# Patient Record
Sex: Female | Born: 1990 | Race: Black or African American | Hispanic: No | Marital: Single | Smoking: Never smoker
Health system: Southern US, Community
[De-identification: ages and names within clinical notes are randomized; demographics above are authoritative.]

## PROBLEM LIST (undated history)

## (undated) ENCOUNTER — Inpatient Hospital Stay (HOSPITAL_COMMUNITY): Payer: Self-pay

## (undated) DIAGNOSIS — R569 Unspecified convulsions: Secondary | ICD-10-CM

## (undated) DIAGNOSIS — G43909 Migraine, unspecified, not intractable, without status migrainosus: Secondary | ICD-10-CM

## (undated) HISTORY — PX: NO PAST SURGERIES: SHX2092

## (undated) HISTORY — DX: Migraine, unspecified, not intractable, without status migrainosus: G43.909

## (undated) HISTORY — DX: Unspecified convulsions: R56.9

---
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
## 2008-01-21 ENCOUNTER — Inpatient Hospital Stay (HOSPITAL_COMMUNITY): Admission: AD | Admit: 2008-01-21 | Discharge: 2008-01-22 | Payer: Self-pay | Admitting: Obstetrics and Gynecology

## 2008-06-01 ENCOUNTER — Inpatient Hospital Stay (HOSPITAL_COMMUNITY): Admission: AD | Admit: 2008-06-01 | Discharge: 2008-06-01 | Payer: Self-pay | Admitting: Obstetrics and Gynecology

## 2008-07-10 ENCOUNTER — Inpatient Hospital Stay (HOSPITAL_COMMUNITY): Admission: AD | Admit: 2008-07-10 | Discharge: 2008-07-13 | Payer: Self-pay | Admitting: Obstetrics and Gynecology

## 2009-01-23 ENCOUNTER — Inpatient Hospital Stay (HOSPITAL_COMMUNITY): Admission: AD | Admit: 2009-01-23 | Discharge: 2009-01-24 | Payer: Self-pay | Admitting: Obstetrics and Gynecology

## 2009-02-12 ENCOUNTER — Emergency Department (HOSPITAL_COMMUNITY): Admission: EM | Admit: 2009-02-12 | Discharge: 2009-02-12 | Payer: Self-pay | Admitting: Emergency Medicine

## 2009-06-10 ENCOUNTER — Encounter: Payer: Self-pay | Admitting: Family Medicine

## 2009-07-13 ENCOUNTER — Inpatient Hospital Stay (HOSPITAL_COMMUNITY): Admission: AD | Admit: 2009-07-13 | Discharge: 2009-07-15 | Payer: Self-pay | Admitting: Obstetrics and Gynecology

## 2010-02-23 NOTE — Miscellaneous (Signed)
Summary: contractions  Clinical Lists Changes mom states her dtr has been laboring since last night. a few hours ago the contactions went to every minute. they have been at 1 minute apart consistantly. she is 37 weeks. told the grandma to call 911 & have her go to women's ed now. she agreed. this is her 1st child.I then looked at chart & she has not been here since 02/23/07. called back to find out where she has been getting her care. the number is d/c'd.Marland KitchenMarland KitchenMarland KitchenGolden Circle RN  Jun 10, 2009 3:30 PM

## 2010-04-11 LAB — CBC
HCT: 35.3 % — ABNORMAL LOW (ref 36.0–46.0)
Hemoglobin: 12 g/dL (ref 12.0–15.0)
MCV: 91.6 fL (ref 78.0–100.0)
Platelets: 198 10*3/uL (ref 150–400)
Platelets: 226 10*3/uL (ref 150–400)
WBC: 11 10*3/uL — ABNORMAL HIGH (ref 4.0–10.5)
WBC: 9.2 10*3/uL (ref 4.0–10.5)

## 2010-04-15 ENCOUNTER — Emergency Department (HOSPITAL_COMMUNITY)
Admission: EM | Admit: 2010-04-15 | Discharge: 2010-04-15 | Disposition: A | Discharge status: Home / Self Care | Payer: BC Managed Care – PPO | Attending: Emergency Medicine | Admitting: Emergency Medicine

## 2010-04-15 DIAGNOSIS — G40909 Epilepsy, unspecified, not intractable, without status epilepticus: Secondary | ICD-10-CM | POA: Insufficient documentation

## 2010-04-15 DIAGNOSIS — M25569 Pain in unspecified knee: Secondary | ICD-10-CM | POA: Insufficient documentation

## 2010-04-15 DIAGNOSIS — M25469 Effusion, unspecified knee: Secondary | ICD-10-CM | POA: Insufficient documentation

## 2010-04-26 LAB — URINALYSIS, ROUTINE W REFLEX MICROSCOPIC
Glucose, UA: NEGATIVE mg/dL
Ketones, ur: 40 mg/dL — AB
Protein, ur: NEGATIVE mg/dL
Specific Gravity, Urine: >1.03 — ABNORMAL HIGH (ref 1.005–1.030)

## 2010-04-26 LAB — COMPREHENSIVE METABOLIC PANEL
Albumin: 3.3 g/dL — ABNORMAL LOW (ref 3.5–5.2)
BUN: 11 mg/dL (ref 6–23)
CO2: 22 mEq/L (ref 19–32)
Creatinine, Ser: 0.69 mg/dL (ref 0.4–1.2)
GFR calc non Af Amer: >60 mL/min (ref >60)
Glucose, Bld: 146 mg/dL — ABNORMAL HIGH (ref 70–99)
Total Protein: 6.7 g/dL (ref 6.0–8.3)

## 2010-04-26 LAB — URINE MICROSCOPIC-ADD ON

## 2010-05-03 LAB — COMPREHENSIVE METABOLIC PANEL
ALT: 25 U/L (ref 0–35)
BUN: 7 mg/dL (ref 6–23)
Chloride: 105 mEq/L (ref 96–112)
Creatinine, Ser: 0.77 mg/dL (ref 0.4–1.2)
GFR calc Af Amer: >60 mL/min (ref >60)
Glucose, Bld: 77 mg/dL (ref 70–99)
Potassium: 3.9 mEq/L (ref 3.5–5.1)
Sodium: 134 mEq/L — ABNORMAL LOW (ref 135–145)
Total Protein: 6.7 g/dL (ref 6.0–8.3)

## 2010-05-03 LAB — CBC
HCT: 28.5 % — ABNORMAL LOW (ref 36.0–46.0)
Hemoglobin: 9.9 g/dL — ABNORMAL LOW (ref 12.0–15.0)
MCHC: 34.6 g/dL (ref 30.0–36.0)
MCHC: 34.8 g/dL (ref 30.0–36.0)
MCV: 90.8 fL (ref 78.0–100.0)
Platelets: 213 10*3/uL (ref 150–400)
RDW: 12.8 % (ref 11.5–15.5)

## 2010-05-04 LAB — URINALYSIS, ROUTINE W REFLEX MICROSCOPIC
Bilirubin Urine: NEGATIVE
Hgb urine dipstick: NEGATIVE
Ketones, ur: NEGATIVE mg/dL
Specific Gravity, Urine: 1.015 (ref 1.005–1.030)

## 2010-05-04 LAB — WET PREP, GENITAL

## 2010-05-04 LAB — STREP B DNA PROBE

## 2010-06-08 NOTE — H&P (Signed)
[name], [name]             ACCOUNT NO.:  [id number]   MEDICAL RECORD NO.:  [id number]          PATIENT TYPE:  INP   LOCATION:  9198                          FACILITY:  [facility]   PHYSICIAN:  Hal Morales, M.D.DATE OF BIRTH:  Dec 29, 1990   DATE OF ADMISSION:  07/10/2008  DATE OF DISCHARGE:                              HISTORY & PHYSICAL   HISTORY:  [name] is a 20 year old gravida 2, para 0-0-1-0, at 26  weeks who presented complaining of spontaneous rupture of membranes at  approximately 5:30 a.m. today with clear fluid noted.  She is unaware of  any contractions.  She reports positive fetal movement.  She denies  headache, visual symptoms or epigastric pain.  Her pregnancy has been  remarkable for:  1. Group-B strep negative.  2. History of seizures with her last one in January 2009, but no      medications routinely.  3. History of Chlamydia in May 2009.   LABORATORY DATA:  Prenatal labs reveal blood type is O+, Rh antibody  negative, VDRL nonreactive, rubella titer positive, hepatitis B surface  antigen negative, HIV was nonreactive.  GC and Chlamydia cultures were  negative in December and in May.  Pap was normal in January.  Sickle  cell test was negative.  Hemoglobin upon entering the practice was 12.3.  It was 12.5 at 28 weeks.  Quadruple screen was done and was normal  Glucola was given and was normal.  Group-B strep culture was negative at  36 weeks.   HISTORY OF PRESENT PREGNANCY:  The patient entered care at approximately  16 weeks.  Quadruple screen was done and was normal.  She had a seizure  in January.  She had her last seizure in May 2009; however, this was  possibly a syncopal episode.  She had a complete workup that was likely  reflecting  a vagal episode, with normal labs, normal  electroencephalogram.  She had a previous one approximately 8 years  prior to that time.  She had been seen at [facility] for that workup.  She had an  ultrasound at 20 weeks, showing  normal growth and normal cervical length and anterior placenta.  She did  have poor weight gain.  She had an ultrasound at 28 weeks with an  estimated fetal weight was at the 53rd percentile.  Cervical length was  normal.  Fluid was normal.  There was a small abdominal circumference  lag.  This was reviewed again at 30 weeks and was normal.  She had  another ultrasound at 32 weeks, again for size less than dates, with  abdominal circumference at the 15th percentile, normal fluid and overall  estimated fetal weight at the 45th percentile.  At 33 weeks, the  abdominal circumference was in at the 12th percentile, again with normal  growth of 4 pounds 3 ounces.  At 40 weeks, at the 40th percentile.  She  had another ultrasound at 35 weeks showing normal growth.  She had  gained a pound.  Group-B strep culture was done and was negative.  She  had  also been seen in the maternity admissions unit with normal  findings.  The rest of her pregnancy has essentially been uncomplicated.   PAST MEDICAL/SURGICAL HISTORY:  1. She had chlamydia in May 2009, and was treated.  2. She reports the usual childhood illnesses.  3. She does have a history of seizures.  Her last seizure was in May      2009.  She had no medications.  This was possibly more likely to be      related to a syncopal episode.  4. She had oral surgery at age of 1.   ALLERGIES:  She has no known medication allergies.   FAMILY HISTORY:  Is noncontributory, with no family history of  hypertension, diabetes, thyroid disease or cancer.  Her sister is  addicted to drugs.   SOCIAL HISTORY:  The patient is Tree surgeon.  She is married to the  father of baby.  He is involved and supportive.  His name is Nancy Nordmann.  The patient is Tree surgeon.  She denies religious  affiliation.  She was originally followed by the physicians, but then  wanted to be followed by the nurse midwife.  She has a high  school  diploma.  She is employed at [employer].  Her husband has a high school  diploma.  He is employed at [employer].   PHYSICAL EXAMINATION:  VITAL SIGNS:  Blood pressure is 136/82, pulse is  68, respirations are 20, temperature is 98.1 degrees.  HEENT: Within  normal limits.  LUNGS:  Bilateral breath sounds are clear.  HEART:  Regular rate and rhythm, without murmur.  BREASTS:  Soft and nontender.  ABDOMEN:  Fundal height is approximately 37 cm.  Estimated fetal weight  is 66-1/2 pounds.  Uterine contractions are very occasional and mild  with some irritability between.  There is a lot of fetal movement noted.  PELVIC EXAM:  The patient is noted be leaking clear fluid.  There is  positive fern.  Cervix is posterior, fingertip, 50% vertex, at a minus  one station.  EXTREMITIES:  Deep tendon reflexes are 2+ without clonus.  There is a  trace edema noted.   IMPRESSION:  1. Intrauterine pregnancy at 39 weeks.  2. Spontaneous rupture of membranes without labor.  3. Group-B Streptococcus negative.   PLAN:  1. Admit to birthing suite per consult with Dr. Hal Morales as      attending physician.  2. Routine certified nurse midwife orders.  3. Plan Cervidil for cervical ripening and Pitocin for labor induction      p.r.n.  4. Pain med p.r.n.      Renaldo Reel Emilee Hero, C.N.M.      Hal Morales, M.D.  Electronically Signed    VLL/MEDQ  D:  07/10/2008  T:  07/10/2008  Job:  [id number]

## 2010-10-21 LAB — URINALYSIS, ROUTINE W REFLEX MICROSCOPIC
Bilirubin Urine: NEGATIVE
Protein, ur: 30 — AB
Urobilinogen, UA: 1
pH: 8

## 2010-10-21 LAB — WET PREP, GENITAL

## 2010-10-21 LAB — POCT PREGNANCY, URINE: Preg Test, Ur: NEGATIVE

## 2010-10-21 LAB — URINE MICROSCOPIC-ADD ON

## 2010-10-26 LAB — URINALYSIS, ROUTINE W REFLEX MICROSCOPIC
Ketones, ur: 15 — AB
Nitrite: NEGATIVE
Protein, ur: NEGATIVE
Urobilinogen, UA: 0.2

## 2010-10-26 LAB — POCT PREGNANCY, URINE: Preg Test, Ur: POSITIVE

## 2010-10-29 LAB — URINALYSIS, ROUTINE W REFLEX MICROSCOPIC
Bilirubin Urine: NEGATIVE
Hgb urine dipstick: NEGATIVE
Ketones, ur: NEGATIVE mg/dL
Nitrite: NEGATIVE
Protein, ur: NEGATIVE mg/dL
Urobilinogen, UA: 0.2 mg/dL (ref 0.0–1.0)

## 2010-10-29 LAB — WET PREP, GENITAL: Trich, Wet Prep: NONE SEEN

## 2010-10-29 LAB — DIFFERENTIAL
Basophils Absolute: 0 10*3/uL (ref 0.0–0.1)
Lymphocytes Relative: 24 % (ref 12–46)
Lymphs Abs: 2.3 10*3/uL (ref 0.7–4.0)
Monocytes Absolute: 0.7 10*3/uL (ref 0.1–1.0)
Neutro Abs: 6.8 10*3/uL (ref 1.7–7.7)

## 2010-10-29 LAB — CBC
Hemoglobin: 12.3 g/dL (ref 12.0–15.0)
RDW: 15.9 % — ABNORMAL HIGH (ref 11.5–15.5)
WBC: 9.9 10*3/uL (ref 4.0–10.5)

## 2010-10-29 LAB — GC/CHLAMYDIA PROBE AMP, GENITAL
Chlamydia, DNA Probe: NEGATIVE
GC Probe Amp, Genital: NEGATIVE

## 2012-07-21 ENCOUNTER — Emergency Department (HOSPITAL_COMMUNITY)
Admission: EM | Admit: 2012-07-21 | Discharge: 2012-07-21 | Disposition: A | Discharge status: Other Acute Inpatient Hospital | Payer: Self-pay | Source: Home / Self Care | Attending: Emergency Medicine | Admitting: Emergency Medicine

## 2012-07-21 ENCOUNTER — Other Ambulatory Visit (HOSPITAL_COMMUNITY)
Admission: RE | Admit: 2012-07-21 | Discharge: 2012-07-21 | Disposition: A | Discharge status: Home / Self Care | Payer: 59 | Source: Ambulatory Visit | Attending: Emergency Medicine | Admitting: Emergency Medicine

## 2012-07-21 ENCOUNTER — Encounter (HOSPITAL_COMMUNITY): Payer: Self-pay | Admitting: *Deleted

## 2012-07-21 DIAGNOSIS — N760 Acute vaginitis: Secondary | ICD-10-CM | POA: Insufficient documentation

## 2012-07-21 DIAGNOSIS — Z113 Encounter for screening for infections with a predominantly sexual mode of transmission: Secondary | ICD-10-CM | POA: Insufficient documentation

## 2012-07-21 DIAGNOSIS — R110 Nausea: Secondary | ICD-10-CM

## 2012-07-21 LAB — POCT I-STAT, CHEM 8
BUN: 11 mg/dL (ref 6–23)
Chloride: 103 mEq/L (ref 96–112)
Sodium: 142 mEq/L (ref 135–145)

## 2012-07-21 LAB — POCT URINALYSIS DIP (DEVICE)
Glucose, UA: NEGATIVE mg/dL
Ketones, ur: NEGATIVE mg/dL
Protein, ur: NEGATIVE mg/dL
Specific Gravity, Urine: 1.025 (ref 1.005–1.030)

## 2012-07-21 LAB — POCT PREGNANCY, URINE: Preg Test, Ur: NEGATIVE

## 2012-07-21 MED ORDER — RANITIDINE HCL 150 MG PO TABS: 150.0000 mg | ORAL_TABLET | Freq: Two times a day (BID) | ORAL | Status: DC

## 2012-07-21 MED ORDER — ONDANSETRON HCL 8 MG PO TABS: 8.0000 mg | ORAL_TABLET | Freq: Three times a day (TID) | ORAL | Status: DC | PRN

## 2012-07-21 NOTE — ED Notes (Signed)
For  The  Last  3  Weeks  The  Pt  Has  Been  C/o  Nausea  Vomiting   Epigastric  Pain     Symptoms  Not  releived  By tums       Also late  On  Her  Period

## 2012-07-21 NOTE — ED Provider Notes (Signed)
Chief Complaint:   Chief Complaint  Patient presents with  . Nausea    History of Present Illness:   Caitlin Rhodes is a 22 year old female with a two-week history of nausea. She vomited once. She has mild lower abdominal pain. She's felt dizzy and lightheaded. Her breasts are sore and swollen. Her period started 3 days ago. It's been 3 light with just some spotting. The bleeding has been dark in color. She's had bad heartburn. Her previous menstrual period was may the 24th. She is sexually active with use of condoms which she estimates at about 50% of the time. She denies fever, chills, upper respiratory symptoms, upper abdominal pain, hematemesis, bilious emesis, coffee-ground emesis, diarrhea, blood in the stool, or melena. She's had no other GYN complaints and denies any urinary symptoms.  Review of Systems:  Other than noted above, the patient denies any of the following symptoms: Systemic:  No fevers, chills, sweats, weight loss or gain, fatigue, or tiredness. ENT:  No nasal congestion, rhinorrhea, or sore throat. Lungs:  No cough, wheezing, or shortness of breath. Cardiac:  No chest pain, syncope, or presyncope. GI:  No abdominal pain, nausea, vomiting, anorexia, diarrhea, constipation, blood in stool or vomitus. GU:  No dysuria, frequency, or urgency.  PMFSH:  Past medical history, family history, social history, meds, and allergies were reviewed.  She has a history of seizures but is not taking any medication for this right now.  Physical Exam:   Vital signs:  BP 135/92  Pulse 62  Temp(Src) 98.5 F (36.9 C) (Oral)  Resp 16  SpO2 100%  LMP 06/16/2012 General:  Alert and oriented.  In no distress.  Skin warm and dry.  Good skin turgor, brisk capillary refill. ENT:  No scleral icterus, moist mucous membranes, no oral lesions, pharynx clear. Lungs:  Breath sounds clear and equal bilaterally.  No wheezes, rales, or rhonchi. Heart:  Rhythm regular, without extrasystoles.  No  gallops or murmers. Abdomen:  Soft, flat, nondistended. There is mild tenderness to palpation in the lower abdomen without guarding or rebound. No organomegaly or mass. Bowel sounds are normally active. Pelvic exam: Normal external genitalia, vaginal and cervical mucosa are normal. There is a small amount of brownish bleeding or discharge the vaginal vault. There is no pain on cervical motion. Uterus is mid position, normal in size and shape and nontender. No adnexal masses or tenderness. Skin: Clear, warm, and dry.  Good turgor.  Brisk capillary refill.  Labs:   Results for orders placed during the hospital encounter of 07/21/12  POCT URINALYSIS DIP (DEVICE)      Result Value Range   Glucose, UA NEGATIVE  NEGATIVE mg/dL   Bilirubin Urine NEGATIVE  NEGATIVE   Ketones, ur NEGATIVE  NEGATIVE mg/dL   Specific Gravity, Urine 1.025  1.005 - 1.030   Hgb urine dipstick SMALL (*) NEGATIVE   pH 7.0  5.0 - 8.0   Protein, ur NEGATIVE  NEGATIVE mg/dL   Urobilinogen, UA 0.2  0.0 - 1.0 mg/dL   Nitrite NEGATIVE  NEGATIVE   Leukocytes, UA NEGATIVE  NEGATIVE  POCT PREGNANCY, URINE      Result Value Range   Preg Test, Ur NEGATIVE  NEGATIVE  POCT I-STAT, CHEM 8      Result Value Range   Sodium 142  135 - 145 mEq/L   Potassium 4.1  3.5 - 5.1 mEq/L   Chloride 103  96 - 112 mEq/L   BUN 11  6 - 23 mg/dL  Creatinine, Ser 1.00  0.50 - 1.10 mg/dL   Glucose, Bld 85  70 - 99 mg/dL   Calcium, Ion 1.61 (*) 1.12 - 1.23 mmol/L   TCO2 26  0 - 100 mmol/L   Hemoglobin 15.3 (*) 12.0 - 15.0 g/dL   HCT 09.6  04.5 - 40.9 %    DNA probes for gonorrhea, Chlamydia, Trichomonas, Gardnerella, Candida were obtained. Results are pending his right now and we'll call the patient back with any positive results.  Assessment:  The encounter diagnosis was Nausea.  It does not appear she is pregnant since her pregnancy test is negative. There is no evidence of dehydration. No evidence of PID. Nausea could be due to viral  gastroenteritis or gastritis. Right now we'll treat with a light diet, Zofran, and ranitidine. Return again if no better in a week.  Plan:   1.  The following meds were prescribed:   Discharge Medication List as of 07/21/2012  6:18 PM    START taking these medications   Details  !! ondansetron (ZOFRAN) 8 MG tablet Take 1 tablet (8 mg total) by mouth every 8 (eight) hours as needed for nausea., Starting 07/21/2012, Until Discontinued, Normal    !! ondansetron (ZOFRAN) 8 MG tablet Take 1 tablet (8 mg total) by mouth every 8 (eight) hours as needed for nausea., Starting 07/21/2012, Until Discontinued, Normal    !! ranitidine (ZANTAC) 150 MG tablet Take 1 tablet (150 mg total) by mouth 2 (two) times daily., Starting 07/21/2012, Until Discontinued, Normal    !! ranitidine (ZANTAC) 150 MG tablet Take 1 tablet (150 mg total) by mouth 2 (two) times daily., Starting 07/21/2012, Until Discontinued, Normal     !! - Potential duplicate medications found. Please discuss with provider.     2.  The patient was instructed in symptomatic care and handouts were given. 3.  The patient was told to return if becoming worse in any way, if no better in 2 or 3 days, and given some red flag symptoms such as persistent vomiting or fever or worsening abdominal pain that would indicate earlier return. 4.  The patient was told to take only sips of clear liquids for the next 24 hours and then advance to a b.r.a.t. Diet. 5.  Follow up here if needed.      Reuben Likes, MD 07/21/12 2027

## 2012-09-01 ENCOUNTER — Ambulatory Visit (INDEPENDENT_AMBULATORY_CARE_PROVIDER_SITE_OTHER): Payer: Self-pay | Admitting: Emergency Medicine

## 2012-09-01 VITALS — BP 120/82 | HR 65 | Temp 97.2°F | Resp 16 | Ht 65.0 in | Wt 133.6 lb

## 2012-09-01 DIAGNOSIS — G40909 Epilepsy, unspecified, not intractable, without status epilepticus: Secondary | ICD-10-CM | POA: Insufficient documentation

## 2012-09-01 DIAGNOSIS — M549 Dorsalgia, unspecified: Secondary | ICD-10-CM

## 2012-09-01 DIAGNOSIS — R109 Unspecified abdominal pain: Secondary | ICD-10-CM

## 2012-09-01 DIAGNOSIS — N898 Other specified noninflammatory disorders of vagina: Secondary | ICD-10-CM

## 2012-09-01 DIAGNOSIS — R002 Palpitations: Secondary | ICD-10-CM

## 2012-09-01 LAB — POCT CBC
Granulocyte percent: 58.9 %G (ref 37–80)
MCV: 94.8 fL (ref 80–97)
MID (cbc): 0.4 (ref 0–0.9)
POC Granulocyte: 3.8 (ref 2–6.9)
POC LYMPH PERCENT: 34.7 %L (ref 10–50)
POC MID %: 6.4 %M (ref 0–12)
Platelet Count, POC: 209 10*3/uL (ref 142–424)
RDW, POC: 14 %

## 2012-09-01 LAB — POCT UA - MICROSCOPIC ONLY
Bacteria, U Microscopic: NEGATIVE
Mucus, UA: NEGATIVE
RBC, urine, microscopic: NEGATIVE
WBC, Ur, HPF, POC: NEGATIVE

## 2012-09-01 LAB — COMPREHENSIVE METABOLIC PANEL
AST: 27 U/L (ref 0–37)
Albumin: 4.4 g/dL (ref 3.5–5.2)
Alkaline Phosphatase: 70 U/L (ref 39–117)
BUN: 12 mg/dL (ref 6–23)
Potassium: 4.3 mEq/L (ref 3.5–5.3)
Sodium: 136 mEq/L (ref 135–145)

## 2012-09-01 LAB — POCT URINALYSIS DIPSTICK
Bilirubin, UA: NEGATIVE
Ketones, UA: NEGATIVE
Leukocytes, UA: NEGATIVE

## 2012-09-01 MED ORDER — FLUCONAZOLE 150 MG PO TABS: 150.0000 mg | ORAL_TABLET | Freq: Once | ORAL | Status: DC

## 2012-09-01 NOTE — Progress Notes (Signed)
[facility] [address], [place] [address] [phone]  Date:  09/01/2012   Name:  Caitlin Rhodes   DOB:  23-Aug-1990   MRN:  [id number]  PCP:  No PCP Per Patient    Chief Complaint: Chest Pain, Shortness of Breath, Palpitations and cramping, low back pain, discharge   History of Present Illness:  Caitlin Rhodes is a 22 y.o. very pleasant female patient who presents with the following:  Multiple complaints.  Has itching, dysuria, urgency and white vaginal discharge.  LMP 7/20.  No contraception.  No fever or chills.  No nausea or vomiting.  Using monistat OTC.  Has some low back pain.   Had chest pain and palpitations repeatedly over the past four days.  No shortness of breath, nausea or vomiting. No radiation.  Episodes last 5-15 minutes.  No improvement with over the counter medications or other home remedies. Denies other complaint or health concern today.   Patient Active Problem List   Diagnosis Date Noted  . TINEA VERSICOLOR 06/01/2006    Past Medical History  Diagnosis Date  . Seizures     No past surgical history on file.  History  Substance Use Topics  . Smoking status: Never Smoker   . Smokeless tobacco: Not on file  . Alcohol Use: Yes    Family History  Problem Relation Age of Onset  . Cancer Maternal Grandmother   . Cancer Paternal Grandmother     No Known Allergies  Medication list has been reviewed and updated.  Current Outpatient Prescriptions on File Prior to Visit  Medication Sig Dispense Refill  . Calcium Carbonate Antacid (TUMS PO) Take by mouth.      . ondansetron (ZOFRAN) 8 MG tablet Take 1 tablet (8 mg total) by mouth every 8 (eight) hours as needed for nausea.  20 tablet  0  . ondansetron (ZOFRAN) 8 MG tablet Take 1 tablet (8 mg total) by mouth every 8 (eight) hours as needed for nausea.  20 tablet  0  . ranitidine (ZANTAC) 150 MG tablet Take 1 tablet (150 mg total) by mouth 2 (two) times daily.  30 tablet  0   . ranitidine (ZANTAC) 150 MG tablet Take 1 tablet (150 mg total) by mouth 2 (two) times daily.  30 tablet  0   No current facility-administered medications on file prior to visit.    Review of Systems:  As per HPI, otherwise negative.    Physical Examination: Filed Vitals:   09/01/12 1329  BP: 120/82  Pulse: 65  Temp: 97.2 F (36.2 C)  Resp: 16   Filed Vitals:   09/01/12 1329  Height: 5\' 5"  (1.651 m)  Weight: 133 lb 9.6 oz (60.601 kg)   Body mass index is 22.23 kg/(m^2). Ideal Body Weight: Weight in (lb) to have BMI = 25: 149.9  GEN: WDWN, NAD, Non-toxic, A & O x 3 HEENT: Atraumatic, Normocephalic. Neck supple. No masses, No LAD. Ears and Nose: No external deformity. CV: RRR, No M/G/R. No JVD. No thrill. No extra heart sounds. PULM: CTA B, no wheezes, crackles, rhonchi. No retractions. No resp. distress. No accessory muscle use. ABD: S, NT, ND, +BS. No rebound. No HSM. EXTR: No c/c/e NEURO Normal gait.  PSYCH: Normally interactive. Conversant. Not depressed or anxious appearing.  Calm demeanor.    Assessment and Plan: Palpitations and chest pain Labs and EKG Follow up with cardiology if needed Vaginitis Diflucan HCG  Signed,  Phillips Odor, MD  Results for orders placed in visit on 09/01/12  POCT URINALYSIS DIPSTICK      Result Value Range   Color, UA yellow     Clarity, UA clear     Glucose, UA neg     Bilirubin, UA neg     Ketones, UA neg     Spec Grav, UA 1.020     Blood, UA neg     pH, UA 7.0     Protein, UA neg     Urobilinogen, UA 0.2     Nitrite, UA neg     Leukocytes, UA Negative    POCT UA - MICROSCOPIC ONLY      Result Value Range   WBC, Ur, HPF, POC neg     RBC, urine, microscopic neg     Bacteria, U Microscopic neg     Mucus, UA neg     Epithelial cells, urine per micros neg     Crystals, Ur, HPF, POC neg     Casts, Ur, LPF, POC neg     Yeast, UA neg    POCT CBC      Result Value Range   WBC 6.5  4.6 - 10.2 K/uL   Lymph,  poc 2.3  0.6 - 3.4   POC LYMPH PERCENT 34.7  10 - 50 %L   MID (cbc) 0.4  0 - 0.9   POC MID % 6.4  0 - 12 %M   POC Granulocyte 3.8  2 - 6.9   Granulocyte percent 58.9  37 - 80 %G   RBC 4.29  4.04 - 5.48 M/uL   Hemoglobin 12.8  12.2 - 16.2 g/dL   HCT, POC 87.5  64.3 - 47.9 %   MCV 94.8  80 - 97 fL   MCH, POC 29.8  27 - 31.2 pg   MCHC 31.4 (*) 31.8 - 35.4 g/dL   RDW, POC 32.9     Platelet Count, POC 209  142 - 424 K/uL   MPV 8.8  0 - 99.8 fL  POCT URINE PREGNANCY      Result Value Range   Preg Test, Ur Negative

## 2012-09-04 ENCOUNTER — Telehealth: Payer: Self-pay | Admitting: Radiology

## 2012-09-04 NOTE — Telephone Encounter (Signed)
Pt was in office with her husband who was being seen and was inquiring about her labs. Gave her results of all labs normal per Dr Dareen Piano.

## 2012-09-07 ENCOUNTER — Ambulatory Visit (INDEPENDENT_AMBULATORY_CARE_PROVIDER_SITE_OTHER): Payer: Self-pay | Admitting: Internal Medicine

## 2012-09-07 VITALS — BP 100/64 | HR 77 | Temp 98.4°F | Resp 18 | Ht 68.0 in | Wt 132.0 lb

## 2012-09-07 DIAGNOSIS — R109 Unspecified abdominal pain: Secondary | ICD-10-CM

## 2012-09-07 DIAGNOSIS — N390 Urinary tract infection, site not specified: Secondary | ICD-10-CM

## 2012-09-07 DIAGNOSIS — R110 Nausea: Secondary | ICD-10-CM

## 2012-09-07 DIAGNOSIS — R350 Frequency of micturition: Secondary | ICD-10-CM

## 2012-09-07 LAB — POCT URINALYSIS DIPSTICK
Bilirubin, UA: NEGATIVE
Blood, UA: NEGATIVE
Spec Grav, UA: 1.025
pH, UA: 6

## 2012-09-07 LAB — POCT UA - MICROSCOPIC ONLY
Mucus, UA: POSITIVE
Yeast, UA: NEGATIVE

## 2012-09-07 LAB — POCT CBC
Lymph, poc: 2.7 (ref 0.6–3.4)
MCH, POC: 29.9 pg (ref 27–31.2)
MCV: 93.4 fL (ref 80–97)
MID (cbc): 0.4 (ref 0–0.9)
POC LYMPH PERCENT: 38 %L (ref 10–50)
Platelet Count, POC: 232 10*3/uL (ref 142–424)
RDW, POC: 14.2 %
WBC: 7.2 10*3/uL (ref 4.6–10.2)

## 2012-09-07 MED ORDER — PHENAZOPYRIDINE HCL 200 MG PO TABS: 200.0000 mg | ORAL_TABLET | Freq: Three times a day (TID) | ORAL | Status: DC | PRN

## 2012-09-07 MED ORDER — CIPROFLOXACIN HCL 500 MG PO TABS: 500.0000 mg | ORAL_TABLET | Freq: Two times a day (BID) | ORAL | Status: DC

## 2012-09-07 NOTE — Patient Instructions (Addendum)
[facility] [address] [place], [place] [address] Tel: [phone] Urinary Tract Infection Urinary tract infections (UTIs) can develop anywhere along your urinary tract. Your urinary tract is your body's drainage system for removing wastes and extra water. Your urinary tract includes two kidneys, two ureters, a bladder, and a urethra. Your kidneys are a pair of bean-shaped organs. Each kidney is about the size of your fist. They are located below your ribs, one on each side of your spine. CAUSES Infections are caused by microbes, which are microscopic organisms, including fungi, viruses, and bacteria. These organisms are so small that they can only be seen through a microscope. Bacteria are the microbes that most commonly cause UTIs. SYMPTOMS  Symptoms of UTIs may vary by age and gender of the patient and by the location of the infection. Symptoms in young women typically include a frequent and intense urge to urinate and a painful, burning feeling in the bladder or urethra during urination. Older women and men are more likely to be tired, shaky, and weak and have muscle aches and abdominal pain. A fever may mean the infection is in your kidneys. Other symptoms of a kidney infection include pain in your back or sides below the ribs, nausea, and vomiting. DIAGNOSIS To diagnose a UTI, your caregiver will ask you about your symptoms. Your caregiver also will ask to provide a urine sample. The urine sample will be tested for bacteria and white blood cells. White blood cells are made by your body to help fight infection. TREATMENT  Typically, UTIs can be treated with medication. Because most UTIs are caused by a bacterial infection, they usually can be treated with the use of antibiotics. The choice of antibiotic and length of treatment depend on your symptoms and the type of bacteria causing your infection. HOME CARE INSTRUCTIONS  If you were prescribed antibiotics,  take them exactly as your caregiver instructs you. Finish the medication even if you feel better after you have only taken some of the medication.  Drink enough water and fluids to keep your urine clear or pale yellow.  Avoid caffeine, tea, and carbonated beverages. They tend to irritate your bladder.  Empty your bladder often. Avoid holding urine for long periods of time.  Empty your bladder before and after sexual intercourse.  After a bowel movement, women should cleanse from front to back. Use each tissue only once. SEEK MEDICAL CARE IF:   You have back pain.  You develop a fever.  Your symptoms do not begin to resolve within 3 days. SEEK IMMEDIATE MEDICAL CARE IF:   You have severe back pain or lower abdominal pain.  You develop chills.  You have nausea or vomiting.  You have continued burning or discomfort with urination. MAKE SURE YOU:   Understand these instructions.  Will watch your condition.  Will get help right away if you are not doing well or get worse. Document Released: 10/20/2004 Document Revised: 07/12/2011 Document Reviewed: 02/18/2011 [facility] Patient Information 2014 [place], [place].

## 2012-09-07 NOTE — Progress Notes (Signed)
Subjective:    Patient ID: Caitlin Rhodes, female    DOB: Apr 24, 1990, 22 y.o.   MRN: [id number]  HPI  22 YO female patient presents to the clinic today with complaints of nausea and cramping that has been going on for a few weeks. The past two days have been getting increasingly worse.  Her last menstrual cycle was July 27. She had a yeast infection last week and used Monistat OTC. This morning she has pain with intercourse. She had cramping afterwards that was out of the ordinary.   Pt has 3 children. She only uses condoms as BC.  Pt has a HO of seizures. She has not had a seizure since 2012.  Has pelvic pain after IC. No fever, chills, diarrhea  Review of Systems  Constitutional: Positive for appetite change and unexpected weight change.  Gastrointestinal: Positive for nausea.  Genitourinary: Positive for frequency, flank pain and pelvic pain.   Her husband had normal recent urinary exam here.STD risk is low.    Objective:   Physical Exam  Constitutional: She is oriented to person, place, and time. She appears well-developed and well-nourished. No distress.  HENT:  Mouth/Throat: Oropharynx is clear and moist.  Eyes: Conjunctivae and EOM are normal. Pupils are equal, round, and reactive to light. No scleral icterus.  Neck: Normal range of motion. Neck supple. No thyromegaly present.  Cardiovascular: Normal rate, regular rhythm and normal heart sounds.   Pulmonary/Chest: Effort normal and breath sounds normal.  Abdominal: She exhibits no distension and no mass. There is tenderness. There is no rebound and no guarding.  Musculoskeletal: Normal range of motion.  Lymphadenopathy:    She has no cervical adenopathy.  Neurological: She is alert and oriented to person, place, and time. She exhibits normal muscle tone. Coordination normal.  Skin: No rash noted.  Psychiatric: She has a normal mood and affect.   Tender over bladder/pelvis. Results for orders placed in visit on  09/07/12  POCT UA - MICROSCOPIC ONLY      Result Value Range   WBC, Ur, HPF, POC 6-8     RBC, urine, microscopic 2-5     Bacteria, U Microscopic 1+     Mucus, UA Pos     Epithelial cells, urine per micros 0-1     Crystals, Ur, HPF, POC neg     Casts, Ur, LPF, POC neg     Yeast, UA neg    POCT URINALYSIS DIPSTICK      Result Value Range   Color, UA yellow     Clarity, UA clear     Glucose, UA neg     Bilirubin, UA neg     Ketones, UA trace     Spec Grav, UA 1.025     Blood, UA neg     pH, UA 6.0     Protein, UA neg     Urobilinogen, UA 1.0     Nitrite, UA neg     Leukocytes, UA Trace    POCT URINE PREGNANCY      Result Value Range   Preg Test, Ur Negative    POCT CBC      Result Value Range   WBC 7.2  4.6 - 10.2 K/uL   Lymph, poc 2.7  0.6 - 3.4   POC LYMPH PERCENT 38.0  10 - 50 %L   MID (cbc) 0.4  0 - 0.9   POC MID % 6.1  0 - 12 %M   POC Granulocyte 4.0  2 - 6.9   Granulocyte percent 55.9  37 - 80 %G   RBC 4.48  4.04 - 5.48 M/uL   Hemoglobin 13.4  12.2 - 16.2 g/dL   HCT, POC 16.1  09.6 - 47.9 %   MCV 93.4  80 - 97 fL   MCH, POC 29.9  27 - 31.2 pg   MCHC 32.1  31.8 - 35.4 g/dL   RDW, POC 04.5     Platelet Count, POC 232  142 - 424 K/uL   MPV 9.1  0 - 99.8 fL         Assessment & Plan:  Nausea/Dysparunia Past hx of seizures/Will see [facility] again UTI.Cipro 500mg  and pyridium

## 2012-09-08 LAB — GC/CHLAMYDIA PROBE AMP: GC Probe RNA: NEGATIVE

## 2012-10-03 ENCOUNTER — Other Ambulatory Visit: Payer: Self-pay | Admitting: Emergency Medicine

## 2012-10-17 ENCOUNTER — Encounter: Payer: Self-pay | Admitting: *Deleted

## 2012-10-22 ENCOUNTER — Ambulatory Visit: Payer: BC Managed Care – PPO | Admitting: Cardiovascular Disease

## 2012-11-27 ENCOUNTER — Encounter: Payer: Self-pay | Admitting: Cardiovascular Disease

## 2012-12-08 ENCOUNTER — Ambulatory Visit (INDEPENDENT_AMBULATORY_CARE_PROVIDER_SITE_OTHER): Payer: Managed Care, Other (non HMO) | Admitting: Physician Assistant

## 2012-12-08 VITALS — BP 98/68 | HR 75 | Temp 99.0°F | Resp 16 | Ht 67.0 in | Wt 136.0 lb

## 2012-12-08 DIAGNOSIS — R110 Nausea: Secondary | ICD-10-CM

## 2012-12-08 DIAGNOSIS — R350 Frequency of micturition: Secondary | ICD-10-CM

## 2012-12-08 DIAGNOSIS — R300 Dysuria: Secondary | ICD-10-CM

## 2012-12-08 LAB — POCT UA - MICROSCOPIC ONLY
Crystals, Ur, HPF, POC: NEGATIVE
Epithelial cells, urine per micros: NEGATIVE
Mucus, UA: POSITIVE
Yeast, UA: NEGATIVE

## 2012-12-08 LAB — POCT URINALYSIS DIPSTICK
Bilirubin, UA: NEGATIVE
Glucose, UA: NEGATIVE
Ketones, UA: NEGATIVE
Nitrite, UA: POSITIVE
Protein, UA: 30
Spec Grav, UA: 1.025
Urobilinogen, UA: 0.2
pH, UA: 5.5

## 2012-12-08 MED ORDER — PHENAZOPYRIDINE HCL 200 MG PO TABS: 200.0000 mg | ORAL_TABLET | Freq: Three times a day (TID) | ORAL | Status: DC | PRN

## 2012-12-08 MED ORDER — FLUCONAZOLE 150 MG PO TABS: 150.0000 mg | ORAL_TABLET | Freq: Once | ORAL | Status: DC

## 2012-12-08 MED ORDER — ONDANSETRON 8 MG PO TBDP: 8.0000 mg | ORAL_TABLET | Freq: Three times a day (TID) | ORAL | Status: DC | PRN

## 2012-12-08 MED ORDER — CIPROFLOXACIN HCL 500 MG PO TABS: 500.0000 mg | ORAL_TABLET | Freq: Two times a day (BID) | ORAL | Status: DC

## 2012-12-08 NOTE — Progress Notes (Signed)
Patient ID: Caitlin Rhodes MRN: [id number], DOB: 08/22/1990, 22 y.o. Date of Encounter: 12/08/2012, 3:49 PM  Primary Physician: No PCP Per Patient  Chief Complaint: urinary frequency and dysuria  HPI: 22 y.o. year old female with presents with 3 day history of urinary frequency, dysuria, and suprapubic pressure. Denies fever, chills, vaginal discharge, or odor. Does admit to some flank pain and nausea.Has tried cranberry pills and Azo which did not seem to help much. LNMP unknown. Had IUD removed 2 weeks ago and has immediately started OCP's.   Patient is otherwise doing well without issues or complaints.  Past Medical History  Diagnosis Date  . Seizures      Home Meds: Prior to Admission medications   Medication Sig Start Date End Date Taking? Authorizing Provider  norethindrone-ethinyl estradiol (JUNEL FE,GILDESS FE,LOESTRIN FE) 1-20 MG-MCG tablet Take 1 tablet by mouth daily.   Yes Historical Provider, MD  Calcium Carbonate Antacid (TUMS PO) Take by mouth.    Historical Provider, MD  ciprofloxacin (CIPRO) 500 MG tablet Take 1 tablet (500 mg total) by mouth 2 (two) times daily. 09/07/12   Jonita Albee, MD  ciprofloxacin (CIPRO) 500 MG tablet Take 1 tablet (500 mg total) by mouth 2 (two) times daily. 12/08/12   Annaleise Burger Jaquita Rector, PA-C  fluconazole (DIFLUCAN) 150 MG tablet TAKE 1 TABLET BY MOUTH NOW, REPEAT IF NEEDED 10/03/12   Eleanore Delia Chimes, PA-C  fluconazole (DIFLUCAN) 150 MG tablet Take 1 tablet (150 mg total) by mouth once. Repeat if needed 12/08/12   Geroge Baseman Kolette Vey, PA-C  ondansetron (ZOFRAN) 8 MG tablet Take 1 tablet (8 mg total) by mouth every 8 (eight) hours as needed for nausea. 07/21/12   Reuben Likes, MD  ondansetron (ZOFRAN-ODT) 8 MG disintegrating tablet Take 1 tablet (8 mg total) by mouth every 8 (eight) hours as needed for nausea. 12/08/12   Nelva Nay, PA-C  phenazopyridine (PYRIDIUM) 200 MG tablet Take 1 tablet (200 mg total) by mouth 3 (three) times daily as  needed for pain. 09/07/12   Jonita Albee, MD  phenazopyridine (PYRIDIUM) 200 MG tablet Take 1 tablet (200 mg total) by mouth 3 (three) times daily as needed for pain. 12/08/12   Boniface Goffe Jaquita Rector, PA-C  ranitidine (ZANTAC) 150 MG tablet Take 1 tablet (150 mg total) by mouth 2 (two) times daily. 07/21/12   Reuben Likes, MD    Allergies: No Known Allergies  History   Social History  . Marital Status: Married    Spouse Name: N/A    Number of Children: N/A  . Years of Education: N/A   Occupational History  . Not on file.   Social History Main Topics  . Smoking status: Never Smoker   . Smokeless tobacco: Not on file  . Alcohol Use: Yes  . Drug Use: No  . Sexual Activity: Not on file   Other Topics Concern  . Not on file   Social History Narrative  . No narrative on file     Review of Systems: Constitutional: negative for chills, fever, night sweats, weight changes, or fatigue  HEENT: negative for vision changes, hearing loss, congestion, rhinorrhea, ST, epistaxis, or sinus pressure Cardiovascular: negative for chest pain or palpitations Respiratory: negative for cough, hemoptysis, wheezing, shortness of breath. Abdominal: positive for suprapubic abdominal pain,   No nausea, vomiting, diarrhea, or constipation Genitourinary: positive for urinary frequency and dysuria. Negative for vaginal discharge, odor, or pelvic pain.  Dermatological: negative for rashes. Neurologic:  negative for headache, dizziness, or syncope   Physical Exam: Blood pressure 98/68, pulse 75, temperature 99 F (37.2 C), temperature source Oral, resp. rate 16, height 5\' 7"  (1.702 m), weight 136 lb (61.689 kg), last menstrual period 12/01/2012, SpO2 100.00%., Body mass index is 21.3 kg/(m^2). General: Well developed, well nourished, in no acute distress. Head: Normocephalic, atraumatic, eyes without discharge, sclera non-icteric, nares are without discharge. External ear normal in appearance. Neck: Supple.  No thyromegaly. Full ROM. No lymphadenopathy. Lungs: Clear bilaterally to auscultation without wheezes, rales, or rhonchi. Breathing is unlabored. Heart: RRR with S1 S2. No murmurs, rubs, or gallops appreciated. Abdominal: +BS x 4. No hepatosplenomegaly, rebound tenderness, or guarding. Positive suprapubic tenderness. No CVA tenderness bilaterally.  Msk:  Strength and tone normal for age. Extremities/Skin: Warm and dry. No clubbing or cyanosis. No edema.  Neuro: Alert and oriented X 3. Moves all extremities spontaneously. Gait is normal. CNII-XII grossly in tact. Psych:  Responds to questions appropriately with a normal affect.   Labs: Results for orders placed in visit on 12/08/12  POCT UA - MICROSCOPIC ONLY      Result Value Range   WBC, Ur, HPF, POC tntc     RBC, urine, microscopic tntc     Bacteria, U Microscopic 3+     Mucus, UA pos     Epithelial cells, urine per micros neg     Crystals, Ur, HPF, POC neg     Casts, Ur, LPF, POC wbc     Yeast, UA neg    POCT URINALYSIS DIPSTICK      Result Value Range   Color, UA yellow     Clarity, UA clear     Glucose, UA neg     Bilirubin, UA neg     Ketones, UA neg     Spec Grav, UA 1.025     Blood, UA trace     pH, UA 5.5     Protein, UA 30     Urobilinogen, UA 0.2     Nitrite, UA pos     Leukocytes, UA moderate (2+)       ASSESSMENT AND PLAN:  22 y.o. year old female with urinary tract infection Urine culture sent Increase fluids Cipro 500 mg bid x 5 days Zofran 8 mg ODT prn nausea Pyridium 200 mg tid prn dysuria Follow up if symptoms worsen or fail to improve.  Grier Mitts, PA-C 12/08/2012 3:49 PM

## 2012-12-10 LAB — URINE CULTURE: Culture: >=100000

## 2013-02-25 ENCOUNTER — Encounter (HOSPITAL_COMMUNITY): Payer: Self-pay | Admitting: Emergency Medicine

## 2013-02-25 ENCOUNTER — Emergency Department (HOSPITAL_COMMUNITY)
Admission: EM | Admit: 2013-02-25 | Discharge: 2013-02-25 | Disposition: A | Discharge status: Home / Self Care | Payer: 59 | Source: Home / Self Care | Attending: Emergency Medicine | Admitting: Emergency Medicine

## 2013-02-25 DIAGNOSIS — M545 Low back pain, unspecified: Secondary | ICD-10-CM

## 2013-02-25 LAB — POCT URINALYSIS DIP (DEVICE)
Bilirubin Urine: NEGATIVE
Glucose, UA: NEGATIVE mg/dL
Hgb urine dipstick: NEGATIVE
Ketones, ur: NEGATIVE mg/dL
Leukocytes, UA: NEGATIVE
Nitrite: NEGATIVE
PROTEIN: NEGATIVE mg/dL
Urobilinogen, UA: 1 mg/dL (ref 0.0–1.0)
pH: 6 (ref 5.0–8.0)

## 2013-02-25 LAB — POCT PREGNANCY, URINE: PREG TEST UR: NEGATIVE

## 2013-02-25 NOTE — ED Provider Notes (Signed)
Medical screening examination/treatment/procedure(s) were performed by non-physician practitioner and as supervising physician I was immediately available for consultation/collaboration.  Leslee Homeavid Steen Bisig, M.D.   Reuben Likesavid C Edon Hoadley, MD 02/25/13 [phone]

## 2013-02-25 NOTE — ED Provider Notes (Signed)
CSN: [id number]     Arrival date & time 02/25/13  1722 History   First MD Initiated Contact with Patient 02/25/13 1849     Chief Complaint  Patient presents with  . Urinary Tract Infection   (Consider location/radiation/quality/duration/timing/severity/associated sxs/prior Treatment) HPI Comments: Patient presents with 1-2 days of vague lower back discomfort, mild nausea without associated fever, vomiting or diarrhea. Denies vaginal bleeding, vaginal discharge, dysuria, hematuria, melena or constipation. Denies flank or abdominal pain. Endorses bilateral breast tenderness. Denies polyuria or polydipsia. No radiation of pain or changes in strength or sensation of lower extremities or genitalia.  LNMP: 5 days PTA.   The history is provided by the patient.    Past Medical History  Diagnosis Date  . Seizures    History reviewed. No pertinent past surgical history. Family History  Problem Relation Age of Onset  . Cancer Maternal Grandmother   . Cancer Paternal Grandmother    History  Substance Use Topics  . Smoking status: Never Smoker   . Smokeless tobacco: Not on file  . Alcohol Use: Yes   OB History   Grav Para Term Preterm Abortions TAB SAB Ect Mult Living                 Review of Systems  All other systems reviewed and are negative.    Allergies  Review of patient's allergies indicates no known allergies.  Home Medications   Current Outpatient Rx  Name  Route  Sig  Dispense  Refill  . Calcium Carbonate Antacid (TUMS PO)   Oral   Take by mouth.         . ciprofloxacin (CIPRO) 500 MG tablet   Oral   Take 1 tablet (500 mg total) by mouth 2 (two) times daily.   20 tablet   0   . ciprofloxacin (CIPRO) 500 MG tablet   Oral   Take 1 tablet (500 mg total) by mouth 2 (two) times daily.   10 tablet   0   . fluconazole (DIFLUCAN) 150 MG tablet      TAKE 1 TABLET BY MOUTH NOW, REPEAT IF NEEDED   2 tablet   0   . fluconazole (DIFLUCAN) 150 MG tablet   Oral   Take 1 tablet (150 mg total) by mouth once. Repeat if needed   2 tablet   0   . norethindrone-ethinyl estradiol (JUNEL FE,GILDESS FE,LOESTRIN FE) 1-20 MG-MCG tablet   Oral   Take 1 tablet by mouth daily.         . ondansetron (ZOFRAN) 8 MG tablet   Oral   Take 1 tablet (8 mg total) by mouth every 8 (eight) hours as needed for nausea.   20 tablet   0   . ondansetron (ZOFRAN-ODT) 8 MG disintegrating tablet   Oral   Take 1 tablet (8 mg total) by mouth every 8 (eight) hours as needed for nausea.   30 tablet   0   . phenazopyridine (PYRIDIUM) 200 MG tablet   Oral   Take 1 tablet (200 mg total) by mouth 3 (three) times daily as needed for pain.   10 tablet   0   . phenazopyridine (PYRIDIUM) 200 MG tablet   Oral   Take 1 tablet (200 mg total) by mouth 3 (three) times daily as needed for pain.   10 tablet   0   . ranitidine (ZANTAC) 150 MG tablet   Oral   Take 1 tablet (150 mg total) by mouth  2 (two) times daily.   30 tablet   0    BP 117/88  Pulse 70  Temp(Src) 98.1 F (36.7 C) (Oral)  SpO2 100% Physical Exam  Nursing note and vitals reviewed. Constitutional: She is oriented to person, place, and time. She appears well-developed and well-nourished. No distress.  HENT:  Head: Normocephalic and atraumatic.  Right Ear: External ear normal.  Left Ear: External ear normal.  Nose: Nose normal.  Eyes: Conjunctivae are normal. No scleral icterus.  Neck: Normal range of motion. Neck supple.  Cardiovascular: Normal rate, regular rhythm and normal heart sounds.   Pulmonary/Chest: Effort normal and breath sounds normal. No respiratory distress. She has no wheezes.  Abdominal: Soft. Bowel sounds are normal. She exhibits no distension and no mass. There is no tenderness. There is no rebound, no guarding and no CVA tenderness.  Musculoskeletal: Normal range of motion.  Neurological: She is alert and oriented to person, place, and time.  Skin: Skin is warm and dry. No rash  noted. No erythema.  Psychiatric: She has a normal mood and affect. Her behavior is normal.    ED Course  Procedures (including critical care time) Labs Review Labs Reviewed  POCT PREGNANCY, URINE   Imaging Review No results found.    MDM  UPT negative. UA normal other than being concentrated. Exam unremarkable. Will advise tylenol or ibuprofen as directed on packaging for lumbago and PCP or GYN follow up if no improvement.    Jess Barters [place], [place] 02/25/13 1940

## 2013-02-25 NOTE — Discharge Instructions (Signed)
Your pregnancy test was negative and your urine studies were normal with the exception of your being somewhat dehydrated. You shoul d make sure that you drink at least 8, eight ounce glasses of water a day and use either tylenol or ibuprofen as directed on the packaging for discomfort. If symptoms do not improve, please follow up with your doctor.

## 2013-02-25 NOTE — ED Notes (Signed)
Complains of lower back ache vaginal area feel more tender during intercourse Nausea Denies burning or discharge No odor

## 2013-04-13 ENCOUNTER — Ambulatory Visit: Payer: 59 | Admitting: Emergency Medicine

## 2013-04-13 VITALS — BP 100/60 | HR 66 | Temp 98.2°F | Resp 12 | Ht 67.5 in | Wt 143.0 lb

## 2013-04-13 DIAGNOSIS — N760 Acute vaginitis: Secondary | ICD-10-CM

## 2013-04-13 DIAGNOSIS — A499 Bacterial infection, unspecified: Secondary | ICD-10-CM

## 2013-04-13 DIAGNOSIS — R109 Unspecified abdominal pain: Secondary | ICD-10-CM

## 2013-04-13 DIAGNOSIS — R10A2 Flank pain, left side: Secondary | ICD-10-CM

## 2013-04-13 DIAGNOSIS — B9689 Other specified bacterial agents as the cause of diseases classified elsewhere: Secondary | ICD-10-CM

## 2013-04-13 DIAGNOSIS — B373 Candidiasis of vulva and vagina: Secondary | ICD-10-CM

## 2013-04-13 DIAGNOSIS — B3731 Acute candidiasis of vulva and vagina: Secondary | ICD-10-CM

## 2013-04-13 LAB — POCT UA - MICROSCOPIC ONLY
CRYSTALS, UR, HPF, POC: NEGATIVE
Casts, Ur, LPF, POC: NEGATIVE
Mucus, UA: POSITIVE
Yeast, UA: NEGATIVE

## 2013-04-13 LAB — POCT URINALYSIS DIPSTICK
Bilirubin, UA: NEGATIVE
Blood, UA: NEGATIVE
Glucose, UA: NEGATIVE
Ketones, UA: NEGATIVE
LEUKOCYTES UA: NEGATIVE
Nitrite, UA: NEGATIVE
PH UA: 5.5
Spec Grav, UA: >=1.03
UROBILINOGEN UA: 0.2

## 2013-04-13 LAB — POCT WET PREP WITH KOH
KOH Prep POC: POSITIVE
Trichomonas, UA: NEGATIVE
Yeast Wet Prep HPF POC: NEGATIVE

## 2013-04-13 LAB — POCT URINE PREGNANCY: Preg Test, Ur: NEGATIVE

## 2013-04-13 MED ORDER — METRONIDAZOLE 500 MG PO TABS: 500.0000 mg | ORAL_TABLET | Freq: Two times a day (BID) | ORAL | Status: DC

## 2013-04-13 MED ORDER — FLUCONAZOLE 150 MG PO TABS: 150.0000 mg | ORAL_TABLET | Freq: Once | ORAL | Status: DC

## 2013-04-13 NOTE — Progress Notes (Signed)
[facility] [address], [place] [address] [phone]  Date:  04/13/2013   Name:  Caitlin Rhodes   DOB:  10/06/1990   MRN:  [id number]  PCP:  No PCP Per Patient    Chief Complaint: Flank Pain   History of Present Illness:  Caitlin Rhodes is a 23 y.o. very pleasant female patient who presents with the following:  Patient recently discontinued OCP and now is sexually active, using condoms 50% of the time. She has treated herself with monistat twice in the past month for presumed yeast infection.  Denies dysuria, urgency or frequency.  [id number].    Patient Active Problem List   Diagnosis Date Noted  . Seizure disorder 09/01/2012  . TINEA VERSICOLOR 06/01/2006    Past Medical History  Diagnosis Date  . Seizures     No past surgical history on file.  History  Substance Use Topics  . Smoking status: Never Smoker   . Smokeless tobacco: Never Used  . Alcohol Use: Yes    Family History  Problem Relation Age of Onset  . Cancer Maternal Grandmother   . Cancer Paternal Grandmother     No Known Allergies  Medication list has been reviewed and updated.  Current Outpatient Prescriptions on File Prior to Visit  Medication Sig Dispense Refill  . Calcium Carbonate Antacid (TUMS PO) Take by mouth.      . ciprofloxacin (CIPRO) 500 MG tablet Take 1 tablet (500 mg total) by mouth 2 (two) times daily.  20 tablet  0  . fluconazole (DIFLUCAN) 150 MG tablet TAKE 1 TABLET BY MOUTH NOW, REPEAT IF NEEDED  2 tablet  0  . norethindrone-ethinyl estradiol (JUNEL FE,GILDESS FE,LOESTRIN FE) 1-20 MG-MCG tablet Take 1 tablet by mouth daily.      . ondansetron (ZOFRAN) 8 MG tablet Take 1 tablet (8 mg total) by mouth every 8 (eight) hours as needed for nausea.  20 tablet  0  . ondansetron (ZOFRAN-ODT) 8 MG disintegrating tablet Take 1 tablet (8 mg total) by mouth every 8 (eight) hours as needed for nausea.  30 tablet  0  . phenazopyridine (PYRIDIUM) 200 MG tablet  Take 1 tablet (200 mg total) by mouth 3 (three) times daily as needed for pain.  10 tablet  0  . phenazopyridine (PYRIDIUM) 200 MG tablet Take 1 tablet (200 mg total) by mouth 3 (three) times daily as needed for pain.  10 tablet  0  . ranitidine (ZANTAC) 150 MG tablet Take 1 tablet (150 mg total) by mouth 2 (two) times daily.  30 tablet  0   No current facility-administered medications on file prior to visit.    Review of Systems:  As per HPI, otherwise negative.    Physical Examination: Filed Vitals:   04/13/13 0836  BP: 100/60  Pulse: 66  Temp: 98.2 F (36.8 C)  Resp: 12   Filed Vitals:   04/13/13 0836  Height: 5' 7.5" (1.715 m)  Weight: 143 lb (64.864 kg)   Body mass index is 22.05 kg/(m^2). Ideal Body Weight: Weight in (lb) to have BMI = 25: 161.7   GEN: WDWN, NAD, Non-toxic, Alert & Oriented x 3 HEENT: Atraumatic, Normocephalic.  Ears and Nose: No external deformity. EXTR: No clubbing/cyanosis/edema NEURO: Normal gait.  PSYCH: Normally interactive. Conversant. Not depressed or anxious appearing.  Calm demeanor.  Physical Examination: Pelvic - normal external genitalia, vulva, vagina marked caseous discharge, cervix, uterus and adnexa, exam chaperoned  Ladona Ridgelaylor  Assessment and Plan: BV Candida Diflucan Flagyl  Signed,  Phillips Odor, MD   Results for orders placed in visit on 04/13/13  POCT UA - MICROSCOPIC ONLY      Result Value Ref Range   WBC, Ur, HPF, POC 2-4     RBC, urine, microscopic 6-7     Bacteria, U Microscopic 1+     Mucus, UA pos     Epithelial cells, urine per micros 3-4     Crystals, Ur, HPF, POC neg     Casts, Ur, LPF, POC neg     Yeast, UA neg    POCT URINALYSIS DIPSTICK      Result Value Ref Range   Color, UA yellow     Clarity, UA clear     Glucose, UA neg     Bilirubin, UA neg     Ketones, UA neg     Spec Grav, UA >=1.030     Blood, UA neg     pH, UA 5.5     Protein, UA trace     Urobilinogen, UA 0.2     Nitrite, UA neg      Leukocytes, UA Negative     Results for orders placed in visit on 04/13/13  POCT UA - MICROSCOPIC ONLY      Result Value Ref Range   WBC, Ur, HPF, POC 2-4     RBC, urine, microscopic 6-7     Bacteria, U Microscopic 1+     Mucus, UA pos     Epithelial cells, urine per micros 3-4     Crystals, Ur, HPF, POC neg     Casts, Ur, LPF, POC neg     Yeast, UA neg    POCT URINALYSIS DIPSTICK      Result Value Ref Range   Color, UA yellow     Clarity, UA clear     Glucose, UA neg     Bilirubin, UA neg     Ketones, UA neg     Spec Grav, UA >=1.030     Blood, UA neg     pH, UA 5.5     Protein, UA trace     Urobilinogen, UA 0.2     Nitrite, UA neg     Leukocytes, UA Negative    POCT URINE PREGNANCY      Result Value Ref Range   Preg Test, Ur Negative    POCT WET PREP WITH KOH      Result Value Ref Range   Trichomonas, UA Negative     Clue Cells Wet Prep HPF POC tntc     Epithelial Wet Prep HPF POC tntc     Yeast Wet Prep HPF POC neg     Bacteria Wet Prep HPF POC 2+     RBC Wet Prep HPF POC 4-6     WBC Wet Prep HPF POC 3-8     KOH Prep POC Positive

## 2013-04-13 NOTE — Patient Instructions (Signed)
Candidal Vulvovaginitis Candidal vulvovaginitis is an infection of the vagina and vulva. The vulva is the skin around the opening of the vagina. This may cause itching and discomfort in and around the vagina.  HOME CARE  Only take medicine as told by your doctor.  Do not have sex (intercourse) until the infection is healed or as told by your doctor.  Practice safe sex.  Tell your sex partner about your infection.  Do not douche or use tampons.  Wear cotton underwear. Do not wear tight pants or panty hose.  Eat yogurt. This may help treat and prevent yeast infections. GET HELP RIGHT AWAY IF:   You have a fever.  Your problems get worse during treatment or do not get better in 3 days.  You have discomfort, irritation, or itching in your vagina or vulva area.  You have pain after sex.  You start to get belly (abdominal) pain. MAKE SURE YOU:  Understand these instructions.  Will watch your condition.  Will get help right away if you are not doing well or get worse. Document Released: 04/08/2008 Document Revised: 04/04/2011 Document Reviewed: 04/08/2008 Exit[employer] Patient Information 2014 [employer]. Bacterial Vaginosis Bacterial vaginosis is a vaginal infection that occurs when the normal balance of bacteria in the vagina is disrupted. It results from an overgrowth of certain bacteria. This is the most common vaginal infection in women of childbearing age. Treatment is important to prevent complications, especially in pregnant women, as it can cause a premature delivery. CAUSES  Bacterial vaginosis is caused by an increase in harmful bacteria that are normally present in smaller amounts in the vagina. Several different kinds of bacteria can cause bacterial vaginosis. However, the reason that the condition develops is not fully understood. RISK FACTORS Certain activities or behaviors can put you at an increased risk of developing bacterial vaginosis, including:  Having a new  sex partner or multiple sex partners.  Douching.  Using an intrauterine device (IUD) for contraception. Women do not get bacterial vaginosis from toilet seats, bedding, swimming pools, or contact with objects around them. SIGNS AND SYMPTOMS  Some women with bacterial vaginosis have no signs or symptoms. Common symptoms include:  Grey vaginal discharge.  A fishlike odor with discharge, especially after sexual intercourse.  Itching or burning of the vagina and vulva.  Burning or pain with urination. DIAGNOSIS  Your health care provider will take a medical history and examine the vagina for signs of bacterial vaginosis. A sample of vaginal fluid may be taken. Your health care provider will look at this sample under a microscope to check for bacteria and abnormal cells. A vaginal pH test may also be done.  TREATMENT  Bacterial vaginosis may be treated with antibiotic medicines. These may be given in the form of a pill or a vaginal cream. A second round of antibiotics may be prescribed if the condition comes back after treatment.  HOME CARE INSTRUCTIONS   Only take over-the-counter or prescription medicines as directed by your health care provider.  If antibiotic medicine was prescribed, take it as directed. Make sure you finish it even if you start to feel better.  Do not have sex until treatment is completed.  Tell all sexual partners that you have a vaginal infection. They should see their health care provider and be treated if they have problems, such as a mild rash or itching.  Practice safe sex by using condoms and only having one sex partner. SEEK MEDICAL CARE IF:   Your   symptoms are not improving after 3 days of treatment.  You have increased discharge or pain.  You have a fever. MAKE SURE YOU:   Understand these instructions.  Will watch your condition.  Will get help right away if you are not doing well or get worse. FOR MORE INFORMATION  Centers for Disease Control  and Prevention, Division of STD Prevention: www.cdc.gov/std American Sexual Health Association (ASHA): www.ashastd.org  Document Released: 01/10/2005 Document Revised: 10/31/2012 Document Reviewed: 08/22/2012 Exit[employer] Patient Information 2014 [employer].  

## 2013-04-15 LAB — GC/CHLAMYDIA PROBE AMP
CT PROBE, AMP APTIMA: NEGATIVE
GC PROBE AMP APTIMA: NEGATIVE

## 2013-04-26 ENCOUNTER — Telehealth: Payer: Self-pay

## 2013-04-26 MED ORDER — FLUCONAZOLE 150 MG PO TABS: 150.0000 mg | ORAL_TABLET | Freq: Once | ORAL | Status: DC

## 2013-04-26 NOTE — Telephone Encounter (Signed)
I have sent two more diflucan pills to the pharmacy.  Take one today. Repeat in 1 week if needed.  If pt is still symptomatic at that time, she needs to RTC

## 2013-04-26 NOTE — Telephone Encounter (Signed)
Left message to return call 

## 2013-04-26 NOTE — Telephone Encounter (Signed)
Patient called stated she is having another yeast infection, symptoms started last night. She is [facility] for [facility] [address]

## 2013-04-27 NOTE — Telephone Encounter (Signed)
Pt.notified

## 2013-06-03 ENCOUNTER — Encounter (HOSPITAL_COMMUNITY): Payer: Self-pay | Admitting: Emergency Medicine

## 2013-06-03 ENCOUNTER — Emergency Department (HOSPITAL_COMMUNITY)
Admission: EM | Admit: 2013-06-03 | Discharge: 2013-06-03 | Disposition: A | Discharge status: Home / Self Care | Payer: 59 | Attending: Emergency Medicine | Admitting: Emergency Medicine

## 2013-06-03 DIAGNOSIS — Z792 Long term (current) use of antibiotics: Secondary | ICD-10-CM | POA: Insufficient documentation

## 2013-06-03 DIAGNOSIS — Z8669 Personal history of other diseases of the nervous system and sense organs: Secondary | ICD-10-CM | POA: Insufficient documentation

## 2013-06-03 DIAGNOSIS — Z79899 Other long term (current) drug therapy: Secondary | ICD-10-CM | POA: Insufficient documentation

## 2013-06-03 DIAGNOSIS — R112 Nausea with vomiting, unspecified: Secondary | ICD-10-CM

## 2013-06-03 DIAGNOSIS — Z3202 Encounter for pregnancy test, result negative: Secondary | ICD-10-CM | POA: Insufficient documentation

## 2013-06-03 LAB — URINE MICROSCOPIC-ADD ON

## 2013-06-03 LAB — CBC WITH DIFFERENTIAL/PLATELET
BASOS ABS: 0 10*3/uL (ref 0.0–0.1)
BASOS PCT: 0 % (ref 0–1)
Eosinophils Absolute: 0.1 10*3/uL (ref 0.0–0.7)
Eosinophils Relative: 1 % (ref 0–5)
HEMATOCRIT: 40.9 % (ref 36.0–46.0)
Hemoglobin: 13.5 g/dL (ref 12.0–15.0)
Lymphocytes Relative: 41 % (ref 12–46)
Lymphs Abs: 3.2 10*3/uL (ref 0.7–4.0)
MCH: 30.1 pg (ref 26.0–34.0)
MCHC: 33 g/dL (ref 30.0–36.0)
MCV: 91.1 fL (ref 78.0–100.0)
MONO ABS: 0.4 10*3/uL (ref 0.1–1.0)
Monocytes Relative: 6 % (ref 3–12)
NEUTROS PCT: 52 % (ref 43–77)
Neutro Abs: 4.1 10*3/uL (ref 1.7–7.7)
Platelets: 340 10*3/uL (ref 150–400)
RBC: 4.49 MIL/uL (ref 3.87–5.11)
RDW: 12.7 % (ref 11.5–15.5)
WBC: 7.8 10*3/uL (ref 4.0–10.5)

## 2013-06-03 LAB — URINALYSIS, ROUTINE W REFLEX MICROSCOPIC
Bilirubin Urine: NEGATIVE
GLUCOSE, UA: NEGATIVE mg/dL
Ketones, ur: NEGATIVE mg/dL
Leukocytes, UA: NEGATIVE
Nitrite: NEGATIVE
PH: 5.5 (ref 5.0–8.0)
Protein, ur: NEGATIVE mg/dL
Specific Gravity, Urine: 1.023 (ref 1.005–1.030)
Urobilinogen, UA: 0.2 mg/dL (ref 0.0–1.0)

## 2013-06-03 LAB — COMPREHENSIVE METABOLIC PANEL
ALBUMIN: 4.4 g/dL (ref 3.5–5.2)
ALT: 48 U/L — ABNORMAL HIGH (ref 0–35)
AST: 41 U/L — ABNORMAL HIGH (ref 0–37)
Alkaline Phosphatase: 89 U/L (ref 39–117)
BILIRUBIN TOTAL: 0.3 mg/dL (ref 0.3–1.2)
BUN: 16 mg/dL (ref 6–23)
CO2: 24 mEq/L (ref 19–32)
CREATININE: 0.9 mg/dL (ref 0.50–1.10)
Calcium: 9.8 mg/dL (ref 8.4–10.5)
Chloride: 103 mEq/L (ref 96–112)
GFR calc Af Amer: >90 mL/min (ref >90)
GFR calc non Af Amer: 89 mL/min — ABNORMAL LOW (ref >90)
Glucose, Bld: 98 mg/dL (ref 70–99)
Potassium: 3.8 mEq/L (ref 3.7–5.3)
Sodium: 141 mEq/L (ref 137–147)
TOTAL PROTEIN: 8 g/dL (ref 6.0–8.3)

## 2013-06-03 LAB — PREGNANCY, URINE: Preg Test, Ur: NEGATIVE

## 2013-06-03 MED ORDER — SODIUM CHLORIDE 0.9 % IV BOLUS (SEPSIS)
1000.0000 mL | Freq: Once | INTRAVENOUS | Status: AC
  Administered 2013-06-03: 1000 mL via INTRAVENOUS

## 2013-06-03 MED ORDER — PROMETHAZINE HCL 25 MG PO TABS: 25.0000 mg | ORAL_TABLET | Freq: Four times a day (QID) | ORAL | Status: DC | PRN

## 2013-06-03 MED ORDER — KETOROLAC TROMETHAMINE 30 MG/ML IJ SOLN
30.0000 mg | Freq: Once | INTRAMUSCULAR | Status: AC
  Administered 2013-06-03: 30 mg via INTRAVENOUS
  Filled 2013-06-03: qty 1

## 2013-06-03 MED ORDER — ONDANSETRON HCL 4 MG/2ML IJ SOLN
4.0000 mg | Freq: Once | INTRAMUSCULAR | Status: AC
  Administered 2013-06-03: 4 mg via INTRAVENOUS
  Filled 2013-06-03: qty 2

## 2013-06-03 NOTE — ED Provider Notes (Signed)
CSN: [id number]     Arrival date & time 06/03/13  0451 History   First MD Initiated Contact with Patient 06/03/13 0522     Chief Complaint  Patient presents with  . Emesis     (Consider location/radiation/quality/duration/timing/severity/associated sxs/prior Treatment) HPI Comments: Patient is a 23 year old female with no prior abdominal surgeries. She presents today with complaints of nausea that has been ongoing for the past 2 weeks. It has been occurring intermittently throughout the day. She woke up approximately one hour ago with nausea and this time began to vomit. She had about 4 episodes of vomiting. She states this was a bilious substance. She denies any fevers or chills. She denies any diarrhea. She denies any urinary complaints. She is currently on her menstrual periods and believes the possibility of pregnancy is unlikely to  Patient is a 23 y.o. female presenting with vomiting. The history is provided by the patient.  Emesis Severity:  Moderate Duration:  2 hours Timing:  Constant Quality:  Bilious material Progression:  Worsening Chronicity:  New Relieved by:  Nothing Worsened by:  Nothing tried Ineffective treatments:  None tried Associated symptoms: no abdominal pain, no diarrhea and no fever     Past Medical History  Diagnosis Date  . Seizures    History reviewed. No pertinent past surgical history. Family History  Problem Relation Age of Onset  . Cancer Maternal Grandmother   . Cancer Paternal Grandmother    History  Substance Use Topics  . Smoking status: Never Smoker   . Smokeless tobacco: Never Used  . Alcohol Use: Yes   OB History   Grav Para Term Preterm Abortions TAB SAB Ect Mult Living                 Review of Systems  Gastrointestinal: Positive for vomiting. Negative for abdominal pain and diarrhea.  All other systems reviewed and are negative.     Allergies  Review of patient's allergies indicates no known allergies.  Home  Medications   Prior to Admission medications   Medication Sig Start Date End Date Taking? Authorizing Provider  Calcium Carbonate Antacid (TUMS PO) Take by mouth.    Historical Provider, MD  ciprofloxacin (CIPRO) 500 MG tablet Take 1 tablet (500 mg total) by mouth 2 (two) times daily. 09/07/12   Jonita Albeehris W Guest, MD  fluconazole (DIFLUCAN) 150 MG tablet TAKE 1 TABLET BY MOUTH NOW, REPEAT IF NEEDED 10/03/12   Godfrey PickEleanore E Egan, PA-C  fluconazole (DIFLUCAN) 150 MG tablet Take 1 tablet (150 mg total) by mouth once. Repeat in 1 week if needed 04/26/13   Eleanore E Debbra RidingEgan, PA-C  metroNIDAZOLE (FLAGYL) 500 MG tablet Take 1 tablet (500 mg total) by mouth 2 (two) times daily with a meal. DO NOT CONSUME ALCOHOL WHILE TAKING THIS MEDICATION. 04/13/13   Phillips OdorJeffery Anderson, MD  norethindrone-ethinyl estradiol (JUNEL FE,GILDESS FE,LOESTRIN FE) 1-20 MG-MCG tablet Take 1 tablet by mouth daily.    Historical Provider, MD  ondansetron (ZOFRAN) 8 MG tablet Take 1 tablet (8 mg total) by mouth every 8 (eight) hours as needed for nausea. 07/21/12   Reuben Likesavid C Keller, MD  ondansetron (ZOFRAN-ODT) 8 MG disintegrating tablet Take 1 tablet (8 mg total) by mouth every 8 (eight) hours as needed for nausea. 12/08/12   Nelva NayHeather M Marte, PA-C  phenazopyridine (PYRIDIUM) 200 MG tablet Take 1 tablet (200 mg total) by mouth 3 (three) times daily as needed for pain. 09/07/12   Jonita Albeehris W Guest, MD  phenazopyridine (PYRIDIUM) 200  MG tablet Take 1 tablet (200 mg total) by mouth 3 (three) times daily as needed for pain. 12/08/12   Heather Jaquita RectorM Marte, PA-C  ranitidine (ZANTAC) 150 MG tablet Take 1 tablet (150 mg total) by mouth 2 (two) times daily. 07/21/12   Reuben Likesavid C Keller, MD   BP 134/100  Pulse 86  Temp(Src) 97.6 F (36.4 C) (Oral)  Resp 16  SpO2 99% Physical Exam  Nursing note and vitals reviewed. Constitutional: She is oriented to person, place, and time. She appears well-developed and well-nourished. No distress.  HENT:  Head: Normocephalic and  atraumatic.  Neck: Normal range of motion. Neck supple.  Cardiovascular: Normal rate and regular rhythm.  Exam reveals no gallop and no friction rub.   No murmur heard. Pulmonary/Chest: Effort normal and breath sounds normal. No respiratory distress. She has no wheezes.  Abdominal: Soft. Bowel sounds are normal. She exhibits no distension. There is no tenderness.  Musculoskeletal: Normal range of motion.  Neurological: She is alert and oriented to person, place, and time.  Skin: Skin is warm and dry. She is not diaphoretic.    ED Course  Procedures (including critical care time) Labs Review Labs Reviewed  CBC WITH DIFFERENTIAL  COMPREHENSIVE METABOLIC PANEL  URINALYSIS, ROUTINE W REFLEX MICROSCOPIC  PREGNANCY, URINE    Imaging Review No results found.   EKG Interpretation None      MDM   Final diagnoses:  None    Patient is a 23 year old female who presents with nausea and his been going on for 2 weeks. She had several episodes of vomiting that occurred this morning. Her abdominal exam is benign and her laboratory studies are all essentially unremarkable.  She is not pregnant and urinalysis is clear. She has no elevation of white count and electrolytes are unremarkable with the exception of mildly elevated transaminases. I am uncertain as to the exact etiology of this, however I doubt gallbladder disease. She was given IV fluids and Zofran and is feeling much better. She will be discharged to home with Phenergan and when necessary followup.    Geoffery Lyonsouglas Lillard Bailon, MD 06/03/13 [phone]

## 2013-06-03 NOTE — Discharge Instructions (Signed)
Phenergan as needed for nausea.  Return to the emergency department if he develops severe abdominal pain, bloody stool, high fever, or other new and concerning symptoms.   Nausea and Vomiting Nausea is a sick feeling that often comes before throwing up (vomiting). Vomiting is a reflex where stomach contents come out of your mouth. Vomiting can cause severe loss of body fluids (dehydration). Children and elderly adults can become dehydrated quickly, especially if they also have diarrhea. Nausea and vomiting are symptoms of a condition or disease. It is important to find the cause of your symptoms. CAUSES   Direct irritation of the stomach lining. This irritation can result from increased acid production (gastroesophageal reflux disease), infection, food poisoning, taking certain medicines (such as nonsteroidal anti-inflammatory drugs), alcohol use, or tobacco use.  Signals from the brain.These signals could be caused by a headache, heat exposure, an inner ear disturbance, increased pressure in the brain from injury, infection, a tumor, or a concussion, pain, emotional stimulus, or metabolic problems.  An obstruction in the gastrointestinal tract (bowel obstruction).  Illnesses such as diabetes, hepatitis, gallbladder problems, appendicitis, kidney problems, cancer, sepsis, atypical symptoms of a heart attack, or eating disorders.  Medical treatments such as chemotherapy and radiation.  Receiving medicine that makes you sleep (general anesthetic) during surgery. DIAGNOSIS Your caregiver may ask for tests to be done if the problems do not improve after a few days. Tests may also be done if symptoms are severe or if the reason for the nausea and vomiting is not clear. Tests may include:  Urine tests.  Blood tests.  Stool tests.  Cultures (to look for evidence of infection).  X-rays or other imaging studies. Test results can help your caregiver make decisions about treatment or the need  for additional tests. TREATMENT You need to stay well hydrated. Drink frequently but in small amounts.You may wish to drink water, sports drinks, clear broth, or eat frozen ice pops or gelatin dessert to help stay hydrated.When you eat, eating slowly may help prevent nausea.There are also some antinausea medicines that may help prevent nausea. HOME CARE INSTRUCTIONS   Take all medicine as directed by your caregiver.  If you do not have an appetite, do not force yourself to eat. However, you must continue to drink fluids.  If you have an appetite, eat a normal diet unless your caregiver tells you differently.  Eat a variety of complex carbohydrates (rice, wheat, potatoes, bread), lean meats, yogurt, fruits, and vegetables.  Avoid high-fat foods because they are more difficult to digest.  Drink enough water and fluids to keep your urine clear or pale yellow.  If you are dehydrated, ask your caregiver for specific rehydration instructions. Signs of dehydration may include:  Severe thirst.  Dry lips and mouth.  Dizziness.  Dark urine.  Decreasing urine frequency and amount.  Confusion.  Rapid breathing or pulse. SEEK IMMEDIATE MEDICAL CARE IF:   You have blood or brown flecks (like coffee grounds) in your vomit.  You have black or bloody stools.  You have a severe headache or stiff neck.  You are confused.  You have severe abdominal pain.  You have chest pain or trouble breathing.  You do not urinate at least once every 8 hours.  You develop cold or clammy skin.  You continue to vomit for longer than 24 to 48 hours.  You have a fever. MAKE SURE YOU:   Understand these instructions.  Will watch your condition.  Will get help right away  if you are not doing well or get worse. Document Released: 01/10/2005 Document Revised: 04/04/2011 Document Reviewed: 06/09/2010 [facility] Patient Information 2014 [place], [place].

## 2013-06-03 NOTE — ED Notes (Signed)
Per Patient: Pt has has intermittent nausea for four days. Tonight approx. 20 minutes ago she began vomiting. Pt reports she has had 4 episodes of emesis in the last 20 minutes. Pt reports she is currently throwing up bile. Pt Ax4, NAD.

## 2016-01-14 ENCOUNTER — Emergency Department (HOSPITAL_COMMUNITY): Payer: No Typology Code available for payment source

## 2016-01-14 ENCOUNTER — Encounter (HOSPITAL_COMMUNITY): Payer: Self-pay | Admitting: Emergency Medicine

## 2016-01-14 ENCOUNTER — Emergency Department (HOSPITAL_COMMUNITY)
Admission: EM | Admit: 2016-01-14 | Discharge: 2016-01-14 | Disposition: A | Discharge status: Home / Self Care | Payer: No Typology Code available for payment source | Attending: Emergency Medicine | Admitting: Emergency Medicine

## 2016-01-14 DIAGNOSIS — Y939 Activity, unspecified: Secondary | ICD-10-CM | POA: Diagnosis not present

## 2016-01-14 DIAGNOSIS — Y92410 Unspecified street and highway as the place of occurrence of the external cause: Secondary | ICD-10-CM | POA: Insufficient documentation

## 2016-01-14 DIAGNOSIS — S199XXA Unspecified injury of neck, initial encounter: Secondary | ICD-10-CM | POA: Insufficient documentation

## 2016-01-14 DIAGNOSIS — M25512 Pain in left shoulder: Secondary | ICD-10-CM | POA: Diagnosis not present

## 2016-01-14 DIAGNOSIS — Y999 Unspecified external cause status: Secondary | ICD-10-CM | POA: Diagnosis not present

## 2016-01-14 LAB — I-STAT BETA HCG BLOOD, ED (MC, WL, AP ONLY)

## 2016-01-14 LAB — I-STAT CHEM 8, ED
BUN: 9 mg/dL (ref 6–20)
Calcium, Ion: 1.18 mmol/L (ref 1.15–1.40)
Chloride: 103 mmol/L (ref 101–111)
Creatinine, Ser: 0.9 mg/dL (ref 0.44–1.00)
Glucose, Bld: 79 mg/dL (ref 65–99)
HEMATOCRIT: 32 % — AB (ref 36.0–46.0)
HEMOGLOBIN: 10.9 g/dL — AB (ref 12.0–15.0)
Potassium: 3.9 mmol/L (ref 3.5–5.1)
SODIUM: 139 mmol/L (ref 135–145)
TCO2: 23 mmol/L (ref 0–100)

## 2016-01-14 MED ORDER — IBUPROFEN 600 MG PO TABS: 600.0000 mg | ORAL_TABLET | Freq: Four times a day (QID) | ORAL | 0 refills | Status: DC | PRN

## 2016-01-14 MED ORDER — KETOROLAC TROMETHAMINE 30 MG/ML IJ SOLN: 30.0000 mg | Freq: Once | INTRAMUSCULAR | Status: DC

## 2016-01-14 MED ORDER — HYDROCODONE-ACETAMINOPHEN 5-325 MG PO TABS
2.0000 | ORAL_TABLET | Freq: Once | ORAL | Status: AC
  Administered 2016-01-14: 2 via ORAL
  Filled 2016-01-14: qty 2

## 2016-01-14 MED ORDER — KETOROLAC TROMETHAMINE 30 MG/ML IJ SOLN
30.0000 mg | Freq: Once | INTRAMUSCULAR | Status: AC
  Administered 2016-01-14: 30 mg via INTRAMUSCULAR
  Filled 2016-01-14: qty 1

## 2016-01-14 MED ORDER — CYCLOBENZAPRINE HCL 10 MG PO TABS: 10.0000 mg | ORAL_TABLET | Freq: Two times a day (BID) | ORAL | 0 refills | Status: DC | PRN

## 2016-01-14 NOTE — ED Notes (Signed)
Pt verbalized understanding discharge instructions and denies any further needs or questions at this time. VS stable, ambulatory and steady gait.   
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
## 2016-01-20 ENCOUNTER — Ambulatory Visit (HOSPITAL_COMMUNITY)
Admission: EM | Admit: 2016-01-20 | Discharge: 2016-01-20 | Disposition: A | Discharge status: Home / Self Care | Payer: Self-pay | Attending: Family Medicine | Admitting: Family Medicine

## 2016-01-20 ENCOUNTER — Encounter (HOSPITAL_COMMUNITY): Payer: Self-pay | Admitting: Emergency Medicine

## 2016-01-20 DIAGNOSIS — R200 Anesthesia of skin: Secondary | ICD-10-CM

## 2016-01-20 DIAGNOSIS — S161XXA Strain of muscle, fascia and tendon at neck level, initial encounter: Secondary | ICD-10-CM

## 2016-01-20 DIAGNOSIS — R202 Paresthesia of skin: Secondary | ICD-10-CM

## 2016-01-20 MED ORDER — METHYLPREDNISOLONE 4 MG PO TBPK: ORAL_TABLET | ORAL | 0 refills | Status: DC
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
## 2016-09-03 ENCOUNTER — Encounter (HOSPITAL_COMMUNITY): Payer: Self-pay | Admitting: Family Medicine

## 2016-09-03 ENCOUNTER — Ambulatory Visit (HOSPITAL_COMMUNITY)
Admission: EM | Admit: 2016-09-03 | Discharge: 2016-09-03 | Disposition: A | Discharge status: Home / Self Care | Payer: Self-pay | Attending: Family Medicine | Admitting: Family Medicine

## 2016-09-03 DIAGNOSIS — R0789 Other chest pain: Secondary | ICD-10-CM

## 2016-09-03 DIAGNOSIS — M940 Chondrocostal junction syndrome [Tietze]: Secondary | ICD-10-CM

## 2016-09-03 MED ORDER — IBUPROFEN 800 MG PO TABS: 800.0000 mg | ORAL_TABLET | Freq: Three times a day (TID) | ORAL | 0 refills | Status: DC

## 2016-09-03 NOTE — ED Triage Notes (Signed)
Pt here for intermittent chest pain since yesterday. Denies cough, fever. sts some SOB. sts she feels very fatigued.

## 2016-09-03 NOTE — ED Provider Notes (Signed)
[facility]    CSN: [id number] Arrival date & time: 09/03/16  1612     History   Chief Complaint Chief Complaint  Patient presents with  . Chest Pain    HPI Caitlin Rhodes is a 26 y.o. female.   This is 26 year old woman who comes in complaining of chest pain for one day. She works as a Engineer, materialsteller.  [facility] when she Started noticing that she had some heaviness in her chest and was becoming a little more difficult to breathe.  Patient's had no history of leg pain, asthma, or smoking. She's had a similar sense of chest fullness in the past. She has no history of trauma to her chest, cough, or recent viral type illness.      Past Medical History:  Diagnosis Date  . Seizures [facility](HCC)     Patient Active Problem List   Diagnosis Date Noted  . Seizure disorder (HCC) 09/01/2012  . TINEA VERSICOLOR 06/01/2006    History reviewed. No pertinent surgical history.  OB History    No data available       Home Medications    Prior to Admission medications   Medication Sig Start Date End Date Taking? Authorizing Provider  ibuprofen (ADVIL,MOTRIN) 800 MG tablet Take 1 tablet (800 mg total) by mouth 3 (three) times daily. 09/03/16   Elvina SidleLauenstein, Kaislyn Gulas, MD    Family History Family History  Problem Relation Age of Onset  . Cancer Maternal Grandmother   . Cancer Paternal Grandmother     Social History Social History  Substance Use Topics  . Smoking status: Never Smoker  . Smokeless tobacco: Never Used  . Alcohol use Yes     Allergies   Patient has no known allergies.   Review of Systems Review of Systems  Cardiovascular: Positive for chest pain.  All other systems reviewed and are negative.    Physical Exam Triage Vital Signs ED Triage Vitals [09/03/16 1632]  Enc Vitals Group     BP 128/77     Pulse Rate 84     Resp 18     Temp 98.6 F (37 C)     Temp src      SpO2 100 %     Weight      Height      Head  Circumference      Peak Flow      Pain Score 7     Pain Loc      Pain Edu?      Excl. in GC?    No data found.   Updated Vital Signs BP 128/77   Pulse 84   Temp 98.6 F (37 C)   Resp 18   LMP 07/30/2016   SpO2 100%    Physical Exam  Constitutional: She is oriented to person, place, and time. She appears well-developed and well-nourished.  HENT:  Right Ear: External ear normal.  Left Ear: External ear normal.  Mouth/Throat: Oropharynx is clear and moist.  Eyes: Pupils are equal, round, and reactive to light. Conjunctivae are normal.  Neck: Normal range of motion. Neck supple.  Cardiovascular: Normal rate, regular rhythm and normal heart sounds.   Pulmonary/Chest: Effort normal and breath sounds normal.  Musculoskeletal: Normal range of motion.  Patient's discomfort in her chest is reproduced by external pressure  Neurological: She is alert and oriented to person, place, and time.  Skin: Skin is warm and dry.  Nursing note and vitals reviewed.  UC Treatments / Results  Labs (all labs ordered are listed, but only abnormal results are displayed) Labs Reviewed - No data to display  EKG  EKG Interpretation None       Radiology No results found.  Procedures Procedures (including critical care time)  Medications Ordered in UC Medications - No data to display   Initial Impression / Assessment and Plan / UC Course  I have reviewed the triage vital signs and the nursing notes.  Pertinent labs & imaging results that were available during my care of the patient were reviewed by me and considered in my medical decision making (see chart for details).     Final Clinical Impressions(s) / UC Diagnoses   Final diagnoses:  Chest wall pain  Costochondritis    New Prescriptions New Prescriptions   IBUPROFEN (ADVIL,MOTRIN) 800 MG TABLET    Take 1 tablet (800 mg total) by mouth 3 (three) times daily.     Controlled Substance Prescriptions [place] Controlled  Substance Registry consulted? Not Applicable   Elvina Sidle, MD 09/03/16 1701

## 2017-05-10 ENCOUNTER — Ambulatory Visit (HOSPITAL_COMMUNITY)
Admission: EM | Admit: 2017-05-10 | Discharge: 2017-05-10 | Disposition: A | Discharge status: Home / Self Care | Payer: Managed Care, Other (non HMO) | Attending: Family Medicine | Admitting: Family Medicine

## 2017-05-10 ENCOUNTER — Encounter (HOSPITAL_COMMUNITY): Payer: Self-pay | Admitting: Emergency Medicine

## 2017-05-10 DIAGNOSIS — N751 Abscess of Bartholin's gland: Secondary | ICD-10-CM

## 2017-05-10 DIAGNOSIS — J029 Acute pharyngitis, unspecified: Secondary | ICD-10-CM | POA: Insufficient documentation

## 2017-05-10 DIAGNOSIS — R509 Fever, unspecified: Secondary | ICD-10-CM | POA: Diagnosis not present

## 2017-05-10 LAB — POCT RAPID STREP A: Streptococcus, Group A Screen (Direct): NEGATIVE

## 2017-05-10 MED ORDER — FLUCONAZOLE 150 MG PO TABS: 150.0000 mg | ORAL_TABLET | Freq: Every day | ORAL | 0 refills | Status: DC

## 2017-05-10 MED ORDER — AMOXICILLIN-POT CLAVULANATE 875-125 MG PO TABS: 1.0000 | ORAL_TABLET | Freq: Two times a day (BID) | ORAL | 0 refills | Status: DC

## 2017-05-10 NOTE — ED Provider Notes (Signed)
[facility]    CSN: [id number] Arrival date & time: 05/10/17  1312     History   Chief Complaint Chief Complaint  Patient presents with  . Vaginal Pain  . Fever    HPI Caitlin Rhodes is a 27 y.o. female.   27 year old female comes in for "bump" on the vaginal area, sore throat, fever.  States noticed a bump to the left of the labia 3 days ago that was nonpainful at first, size has increased since then with painful palpation.  States she does shave the area, but have not done so for a month.  Denies vaginal discharge.  States has a copper IUD, and gets  cyclic yeast infection, but not currently.  Denies abdominal pain.  Nausea without vomiting.  LMP 04/25/2017.  Sexually active with one female partner, no condom use.  Has also had 2-day history of sore throat with subjective fever, chills, body aches.  Denies rhinorrhea, nasal congestion, cough.  Has been taking Tylenol and TheraFlu without relief.  Positive sick contact.     Past Medical History:  Diagnosis Date  . Seizures [facility])     Patient Active Problem List   Diagnosis Date Noted  . Seizure disorder (HCC) 09/01/2012  . TINEA VERSICOLOR 06/01/2006    History reviewed. No pertinent surgical history.  OB History   None      Home Medications    Prior to Admission medications   Medication Sig Start Date End Date Taking? Authorizing Provider  amoxicillin-clavulanate (AUGMENTIN) 875-125 MG tablet Take 1 tablet by mouth every 12 (twelve) hours. 05/10/17   Cathie Hoops, Donevan Biller V, PA-C  fluconazole (DIFLUCAN) 150 MG tablet Take 1 tablet (150 mg total) by mouth daily. Take second dose 72 hours later if symptoms still persists. 05/10/17   Cathie Hoops, Branson Kranz V, PA-C  ibuprofen (ADVIL,MOTRIN) 800 MG tablet Take 1 tablet (800 mg total) by mouth 3 (three) times daily. 09/03/16   Elvina Sidle, MD    Family History Family History  Problem Relation Age of Onset  . Cancer Maternal Grandmother   . Cancer Paternal Grandmother      Social History Social History   Tobacco Use  . Smoking status: Never Smoker  . Smokeless tobacco: Never Used  Substance Use Topics  . Alcohol use: Yes  . Drug use: No     Allergies   Patient has no known allergies.   Review of Systems Review of Systems  Reason unable to perform ROS: See HPI as above.     Physical Exam Triage Vital Signs ED Triage Vitals [05/10/17 1336]  Enc Vitals Group     BP 112/74     Pulse Rate 80     Resp 18     Temp 98.3 F (36.8 C)     Temp Source Oral     SpO2 100 %     Weight      Height      Head Circumference      Peak Flow      Pain Score      Pain Loc      Pain Edu?      Excl. in GC?    No data found.  Updated Vital Signs BP 112/74 (BP Location: Right Arm)   Pulse 80   Temp 98.3 F (36.8 C) (Oral)   Resp 18   SpO2 100%   Physical Exam  Constitutional: She is oriented to person, place, and time. She appears well-developed and well-nourished.  Non-toxic appearance. She does not have a sickly appearance. She does not appear ill. No distress.  HENT:  Head: Normocephalic and atraumatic.  Right Ear: Tympanic membrane, external ear and ear canal normal. Tympanic membrane is not erythematous and not bulging.  Left Ear: Tympanic membrane, external ear and ear canal normal. Tympanic membrane is not erythematous and not bulging.  Nose: Nose normal. Right sinus exhibits no maxillary sinus tenderness and no frontal sinus tenderness. Left sinus exhibits no maxillary sinus tenderness and no frontal sinus tenderness.  Mouth/Throat: Uvula is midline, oropharynx is clear and moist and mucous membranes are normal.  Eyes: Pupils are equal, round, and reactive to light. Conjunctivae are normal.  Neck: Normal range of motion. Neck supple.  Cardiovascular: Normal rate, regular rhythm and normal heart sounds. Exam reveals no gallop and no friction rub.  No murmur heard. Pulmonary/Chest: Effort normal and breath sounds normal. No accessory  muscle usage or stridor. No respiratory distress. She has no decreased breath sounds. She has no wheezes. She has no rhonchi. She has no rales.  Genitourinary:     Genitourinary Comments: 2.5cm round left bartholin cyst that is fluctuant. Tender to palpation. No surrounding erythema, increased warmth.   Lymphadenopathy:    She has no cervical adenopathy.  Neurological: She is alert and oriented to person, place, and time.  Skin: Skin is warm and dry. She is not diaphoretic.  Psychiatric: She has a normal mood and affect. Her behavior is normal. Judgment normal.     UC Treatments / Results  Labs (all labs ordered are listed, but only abnormal results are displayed) Labs Reviewed  CULTURE, GROUP A STREP [facility])  POCT RAPID STREP A    EKG None Radiology No results found.  Procedures Incision and Drainage Date/Time: 05/10/2017 2:41 PM Performed by: Belinda FisherYu, Jeran Hiltz V, PA-C Authorized by: Elvina SidleLauenstein, Kurt, MD   Consent:    Consent obtained:  Verbal   Consent given by:  Patient   Risks discussed:  Bleeding, incomplete drainage, infection and pain   Alternatives discussed:  Referral Location:    Type:  Bartholin cyst   Size:  2.5   Location:  Anogenital   Anogenital location:  Bartholin's gland Anesthesia (see MAR for exact dosages):    Anesthesia method:  Local infiltration   Local anesthetic:  Lidocaine 2% w/o epi Procedure type:    Complexity:  Simple Procedure details:    Needle aspiration: yes     Needle size:  18 G   Incision types:  Stab incision   Incision depth:  Dermal   Scalpel blade:  11   Wound management:  Extensive cleaning   Drainage:  Purulent and bloody   Drainage amount:  Moderate   Wound treatment:  Wound left open   Packing materials:  None Post-procedure details:    Patient tolerance of procedure:  Tolerated well, no immediate complications   (including critical care time)  Medications Ordered in UC Medications - No data to display   Initial  Impression / Assessment and Plan / UC Course  I have reviewed the triage vital signs and the nursing notes.  Pertinent labs & imaging results that were available during my care of the patient were reviewed by me and considered in my medical decision making (see chart for details).    Patient tolerated I&D well, still with some swelling, no more purulent drainage was able to be obtained. Will start patient on Augmentin, warm compress. Diflucan provided for yeast. Patient has GYN appointment tomorrow,  will have patient follow up for reevaluation.   Rapid strep negative. No obvious signs of URI/allergies on exam. Will have patient monitor for now. Return precautions given. Patient expresses understanding and agrees to plan.   Final Clinical Impressions(s) / UC Diagnoses   Final diagnoses:  Bartholin's gland abscess    ED Discharge Orders        Ordered    amoxicillin-clavulanate (AUGMENTIN) 875-125 MG tablet  Every 12 hours     05/10/17 1436    fluconazole (DIFLUCAN) 150 MG tablet  Daily     05/10/17 1436        Belinda Fisher, PA-C 05/10/17 1444

## 2017-05-10 NOTE — Discharge Instructions (Signed)
Abscess drained, but may still have some remaining. Start Augmentin as directed. Diflucan for yeast. Warm compresses. Follow up with GYN as scheduled for reevaluation needed.

## 2017-05-10 NOTE — ED Triage Notes (Signed)
Pt sts "bump" on vaginal area and sore throat with fever

## 2017-05-12 LAB — CULTURE, GROUP A STREP (THRC)

## 2017-12-04 ENCOUNTER — Encounter (HOSPITAL_COMMUNITY): Payer: Self-pay

## 2017-12-04 ENCOUNTER — Inpatient Hospital Stay (HOSPITAL_COMMUNITY): Payer: BLUE CROSS/BLUE SHIELD

## 2017-12-04 ENCOUNTER — Inpatient Hospital Stay (HOSPITAL_COMMUNITY)
Admission: AD | Admit: 2017-12-04 | Discharge: 2017-12-04 | Disposition: A | Discharge status: Home / Self Care | Payer: BLUE CROSS/BLUE SHIELD | Source: Ambulatory Visit | Attending: Obstetrics & Gynecology | Admitting: Obstetrics & Gynecology

## 2017-12-04 DIAGNOSIS — O26891 Other specified pregnancy related conditions, first trimester: Secondary | ICD-10-CM | POA: Diagnosis not present

## 2017-12-04 DIAGNOSIS — O418X10 Other specified disorders of amniotic fluid and membranes, first trimester, not applicable or unspecified: Secondary | ICD-10-CM

## 2017-12-04 DIAGNOSIS — R1030 Lower abdominal pain, unspecified: Secondary | ICD-10-CM | POA: Insufficient documentation

## 2017-12-04 DIAGNOSIS — O208 Other hemorrhage in early pregnancy: Secondary | ICD-10-CM | POA: Insufficient documentation

## 2017-12-04 DIAGNOSIS — R109 Unspecified abdominal pain: Secondary | ICD-10-CM

## 2017-12-04 DIAGNOSIS — N898 Other specified noninflammatory disorders of vagina: Secondary | ICD-10-CM | POA: Insufficient documentation

## 2017-12-04 DIAGNOSIS — Z3491 Encounter for supervision of normal pregnancy, unspecified, first trimester: Secondary | ICD-10-CM

## 2017-12-04 DIAGNOSIS — Z3A09 9 weeks gestation of pregnancy: Secondary | ICD-10-CM

## 2017-12-04 DIAGNOSIS — O468X1 Other antepartum hemorrhage, first trimester: Secondary | ICD-10-CM

## 2017-12-04 LAB — POCT PREGNANCY, URINE: Preg Test, Ur: POSITIVE — AB

## 2017-12-04 LAB — URINALYSIS, ROUTINE W REFLEX MICROSCOPIC
BILIRUBIN URINE: NEGATIVE
Glucose, UA: NEGATIVE mg/dL
Hgb urine dipstick: NEGATIVE
Ketones, ur: NEGATIVE mg/dL
Leukocytes, UA: NEGATIVE
NITRITE: NEGATIVE
PH: 6 (ref 5.0–8.0)
Protein, ur: NEGATIVE mg/dL
Specific Gravity, Urine: 1.021 (ref 1.005–1.030)

## 2017-12-04 LAB — CBC
HEMATOCRIT: 35.9 % — AB (ref 36.0–46.0)
Hemoglobin: 12.2 g/dL (ref 12.0–15.0)
MCH: 29.5 pg (ref 26.0–34.0)
MCHC: 34 g/dL (ref 30.0–36.0)
MCV: 86.9 fL (ref 80.0–100.0)
Platelets: 264 10*3/uL (ref 150–400)
RBC: 4.13 MIL/uL (ref 3.87–5.11)
RDW: 15.5 % (ref 11.5–15.5)
WBC: 8.6 10*3/uL (ref 4.0–10.5)
nRBC: 0 % (ref 0.0–0.2)

## 2017-12-04 LAB — HCG, QUANTITATIVE, PREGNANCY: hCG, Beta Chain, Quant, S: 147359 m[IU]/mL — ABNORMAL HIGH (ref <5)

## 2017-12-04 LAB — WET PREP, GENITAL
SPERM: NONE SEEN
Trich, Wet Prep: NONE SEEN
YEAST WET PREP: NONE SEEN

## 2017-12-04 LAB — ABO/RH: ABO/RH(D): O POS

## 2017-12-04 NOTE — Discharge Instructions (Signed)
Subchorionic Hematoma °A subchorionic hematoma is a gathering of blood between the outer wall of the placenta and the inner wall of the womb (uterus). The placenta is the organ that connects the fetus to the wall of the uterus. The placenta performs the feeding, breathing (oxygen to the fetus), and waste removal (excretory work) of the fetus. °Subchorionic hematoma is the most common abnormality found on a result from ultrasonography done during the first trimester or early second trimester of pregnancy. If there has been little or no vaginal bleeding, early small hematomas usually shrink on their own and do not affect your baby or pregnancy. The blood is gradually absorbed over 1-2 weeks. When bleeding starts later in pregnancy or the hematoma is larger or occurs in an older pregnant woman, the outcome may not be as good. Larger hematomas may get bigger, which increases the chances for miscarriage. Subchorionic hematoma also increases the risk of premature detachment of the placenta from the uterus, preterm (premature) labor, and stillbirth. °Follow these instructions at home: °· Avoid heavy lifting (more than 10 lb [4.5 kg]), exercise, sexual intercourse, or douching as directed by your health care provider. °· Keep track of the number of pads you use each day and how soaked (saturated) they are. Write down this information. °· Do not use tampons. °· Keep all follow-up appointments as directed by your health care provider. Your health care provider may ask you to have follow-up blood tests or ultrasound tests or both. °Get help right away if: °· You have severe cramps in your stomach, back, abdomen, or pelvis. °· You have a fever. °· You pass large clots or tissue. Save any tissue for your health care provider to look at. °· Your bleeding increases or you become lightheaded, feel weak, or have fainting episodes. °This information is not intended to replace advice given to you by your health care provider. Make  sure you discuss any questions you have with your health care provider. °Document Released: 04/27/2006 Document Revised: 06/18/2015 Document Reviewed: 08/09/2012 °Elsevier Interactive Patient Education © 2017 Elsevier Inc. ° °

## 2017-12-04 NOTE — MAU Provider Note (Signed)
Chief Complaint: Possible Pregnancy and Abdominal Pain   First Provider Initiated Contact with Patient 12/04/17 0151     SUBJECTIVE HPI: Caitlin Rhodes is a 27 y.o. [id number] at [redacted]w[redacted]d by LMP who presents to [facility] reporting abdominal pain. Symptoms began yesterday and have worsened tonight. Pain throughout lower abdomen but worse in LLQ. Also reports moderate milky discharge. No odor or irritation. Denies n/v/d, constipation, dysuria, fever, or vaginal bleeding. Last BM was today.   Location: lower abdomen Quality: cramping, sharp Severity: 7/10 on pain scale Duration: 1 day Timing: constant Modifying factors: nothing makes better or worse. Hasn't tx symptoms Associated signs and symptoms: vaginal discharge  Past Medical History:  Diagnosis Date  . Seizures (HCC)    OB History  Gravida Para Term Preterm AB Living  5 3 3  0 1 3  SAB TAB Ectopic Multiple Live Births  1 0 0 0 3    # Outcome Date GA Lbr Len/2nd Weight Sex Delivery Anes PTL Lv  5 Current           4 SAB           3 Term      Vag-Spont     2 Term      Vag-Spont     1 Term      Vag-Spont      History reviewed. No pertinent surgical history. Social History   Socioeconomic History  . Marital status: Single    Spouse name: Not on file  . Number of children: Not on file  . Years of education: Not on file  . Highest education level: Not on file  Occupational History  . Not on file  Social Needs  . Financial resource strain: Not on file  . Food insecurity:    Worry: Not on file    Inability: Not on file  . Transportation needs:    Medical: Not on file    Non-medical: Not on file  Tobacco Use  . Smoking status: Never Smoker  . Smokeless tobacco: Never Used  Substance and Sexual Activity  . Alcohol use: Yes  . Drug use: No  . Sexual activity: Yes  Lifestyle  . Physical activity:    Days per week: Not on file    Minutes per session: Not on file  . Stress: Not on file  Relationships   . Social connections:    Talks on phone: Not on file    Gets together: Not on file    Attends religious service: Not on file    Active member of club or organization: Not on file    Attends meetings of clubs or organizations: Not on file    Relationship status: Not on file  . Intimate partner violence:    Fear of current or ex partner: Not on file    Emotionally abused: Not on file    Physically abused: Not on file    Forced sexual activity: Not on file  Other Topics Concern  . Not on file  Social History Narrative  . Not on file   Family History  Problem Relation Age of Onset  . Cancer Maternal Grandmother   . Cancer Paternal Grandmother    No current facility-administered medications on file prior to encounter.    Current Outpatient Medications on File Prior to Encounter  Medication Sig Dispense Refill  . Probiotic Product (PROBIOTIC PO) Take by mouth.     No Known Allergies  I have reviewed patient's Past Medical Hx,  Surgical Hx, Family Hx, Social Hx, medications and allergies.   Review of Systems  Constitutional: Negative.   Gastrointestinal: Positive for abdominal pain. Negative for constipation, diarrhea, nausea and vomiting.  Genitourinary: Positive for vaginal discharge. Negative for dysuria and vaginal bleeding.    OBJECTIVE Patient Vitals for the past 24 hrs:  BP Temp Temp src Pulse Resp SpO2 Height Weight  12/04/17 0139 122/84 98.3 F (36.8 C) Oral 81 16 100 % - -  12/04/17 0134 - - - - - - 5\' 7"  (1.702 m) 62.1 kg   Constitutional: Well-developed, well-nourished female in no acute distress.  Cardiovascular: normal rate & rhythm, no murmur Respiratory: normal rate and effort. Lung sounds clear throughout GI: Abd soft, non-tender, Pos BS x 4. No guarding or rebound tenderness MS: Extremities nontender, no edema, normal ROM Neurologic: Alert and oriented x 4.  GU:     BIMANUAL: No CMT. cervix closed; uterus enlarged ~10 wks size, no adnexal tenderness or  masses.    LAB RESULTS Results for orders placed or performed during the hospital encounter of 12/04/17 (from the past 24 hour(s))  Urinalysis, Routine w reflex microscopic     Status: None   Collection Time: 12/04/17  1:40 AM  Result Value Ref Range   Color, Urine YELLOW YELLOW   APPearance CLEAR CLEAR   Specific Gravity, Urine 1.021 1.005 - 1.030   pH 6.0 5.0 - 8.0   Glucose, UA NEGATIVE NEGATIVE mg/dL   Hgb urine dipstick NEGATIVE NEGATIVE   Bilirubin Urine NEGATIVE NEGATIVE   Ketones, ur NEGATIVE NEGATIVE mg/dL   Protein, ur NEGATIVE NEGATIVE mg/dL   Nitrite NEGATIVE NEGATIVE   Leukocytes, UA NEGATIVE NEGATIVE  Pregnancy, urine POC     Status: Abnormal   Collection Time: 12/04/17  1:42 AM  Result Value Ref Range   Preg Test, Ur POSITIVE (A) NEGATIVE  CBC     Status: Abnormal   Collection Time: 12/04/17  2:22 AM  Result Value Ref Range   WBC 8.6 4.0 - 10.5 K/uL   RBC 4.13 3.87 - 5.11 MIL/uL   Hemoglobin 12.2 12.0 - 15.0 g/dL   HCT 16.1 (L) 09.6 - 04.5 %   MCV 86.9 80.0 - 100.0 fL   MCH 29.5 26.0 - 34.0 pg   MCHC 34.0 30.0 - 36.0 g/dL   RDW 40.9 81.1 - 91.4 %   Platelets 264 150 - 400 K/uL   nRBC 0.0 0.0 - 0.2 %  ABO/Rh     Status: None   Collection Time: 12/04/17  2:22 AM  Result Value Ref Range   ABO/RH(D)      O POS Performed at [facility], [address], [place], [place] [address]   Wet prep, genital     Status: Abnormal   Collection Time: 12/04/17  2:32 AM  Result Value Ref Range   Yeast Wet Prep HPF POC NONE SEEN NONE SEEN   Trich, Wet Prep NONE SEEN NONE SEEN   Clue Cells Wet Prep HPF POC PRESENT (A) NONE SEEN   WBC, Wet Prep HPF POC FEW (A) NONE SEEN   Sperm NONE SEEN     IMAGING US Ob Comp Less 14 Wks  Result Date: 12/04/2017 CLINICAL DATA:  Acute onset of left lower quadrant abdominal pain. EXAM: OBSTETRIC <14 WK ULTRASOUND TECHNIQUE: Transabdominal ultrasound was performed for evaluation of the gestation as well as the maternal uterus and  adnexal regions. COMPARISON:  None. FINDINGS: Intrauterine gestational sac: Single; visualized and normal in shape. Yolk  sac:  Yes Embryo:  Yes Cardiac Activity: Yes Heart Rate: 175 bpm CRL: 22.0 mm   8 w 6 d                  [place] EDC: 07/10/2018 Subchorionic hemorrhage: A small amount of subchorionic hemorrhage is noted. Maternal uterus/adnexae: The uterus is otherwise unremarkable in appearance. The ovaries are within normal limits. The right ovary measures 3.7 x 2.0 x 1.9 cm, while the left ovary measures 4.1 x 2.5 x 3.0 cm. No suspicious adnexal masses are seen; there is no evidence for ovarian torsion. Trace free fluid is seen within the pelvic cul-de-sac. IMPRESSION: 1. Single live intrauterine pregnancy, with a crown-rump length of 2.2 cm, corresponding to a gestational age of [redacted] weeks 6 days. This matches the gestational age of [redacted] weeks 5 days by LMP, reflecting an estimated date of delivery of July 04, 2018. 2. Small amount of subchorionic hemorrhage noted. Electronically Signed   By: Roanna Raider M.D.   On: 12/04/2017 03:16    MAU COURSE Orders Placed This Encounter  Procedures  . Wet prep, genital  . US OB Comp Less 14 Wks  . Urinalysis, Routine w reflex microscopic  . CBC  . hCG, quantitative, pregnancy  . Pregnancy, urine POC  . ABO/Rh  . Discharge patient   No orders of the defined types were placed in this encounter.   MDM +UPT UA, wet prep, GC/chlamydia, CBC, ABO/Rh, quant hCG, and [place] today to rule out ectopic pregnancy Ultrasound shows live IUP c/w LMP dating & small [facility] Wet prep + clue cells. Pt plans on starting care with [facility] ob/gyn later this week & will have them test her for BV as she was just treated for it a few weeks ago.   ASSESSMENT 1. Normal IUP (intrauterine pregnancy) on prenatal ultrasound, first trimester   2. Abdominal pain during pregnancy in first trimester   3. [redacted] weeks gestation of pregnancy   4. Subchorionic hematoma in first trimester, single or  unspecified fetus     PLAN Discharge home in stable condition. SAB precautions GC/CT pending  Follow-up Information    [facility] Follow up.   Contact information: [address] [place] [address] [place] [address] [phone]          Allergies as of 12/04/2017   No Known Allergies     Medication List    TAKE these medications   PROBIOTIC PO Take by mouth.        Judeth Horn, NP 12/04/2017  3:21 AM

## 2017-12-04 NOTE — MAU Note (Signed)
Pt reports a +upt. Reports constant cramping and sharp LLQ pain that started yesterday. Pt denies vaginal bleeding but reports a milky discharge. LMP: 09/27/2017.

## 2017-12-05 LAB — GC/CHLAMYDIA PROBE AMP (~~LOC~~) NOT AT ARMC
Chlamydia: NEGATIVE
NEISSERIA GONORRHEA: NEGATIVE

## 2017-12-19 LAB — OB RESULTS CONSOLE HGB/HCT, BLOOD: Hemoglobin: 14

## 2017-12-19 LAB — OB RESULTS CONSOLE ANTIBODY SCREEN: ANTIBODY SCREEN: NEGATIVE

## 2017-12-19 LAB — OB RESULTS CONSOLE HEPATITIS B SURFACE ANTIGEN: Hepatitis B Surface Ag: NEGATIVE

## 2017-12-19 LAB — OB RESULTS CONSOLE ABO/RH: RH Type: POSITIVE

## 2017-12-19 LAB — OB RESULTS CONSOLE RPR: RPR: NONREACTIVE

## 2017-12-19 LAB — OB RESULTS CONSOLE HIV ANTIBODY (ROUTINE TESTING): HIV: NONREACTIVE

## 2017-12-19 LAB — OB RESULTS CONSOLE VARICELLA ZOSTER ANTIBODY, IGG: Varicella: IMMUNE

## 2017-12-19 LAB — OB RESULTS CONSOLE PLATELET COUNT: Platelets: 298

## 2017-12-19 LAB — OB RESULTS CONSOLE RUBELLA ANTIBODY, IGM: Rubella: IMMUNE

## 2017-12-19 LAB — OB RESULTS CONSOLE GC/CHLAMYDIA
Chlamydia: NEGATIVE
GC PROBE AMP, GENITAL: NEGATIVE
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
## 2018-02-13 ENCOUNTER — Encounter (HOSPITAL_COMMUNITY): Payer: Self-pay

## 2018-02-13 ENCOUNTER — Other Ambulatory Visit: Payer: Self-pay

## 2018-02-13 ENCOUNTER — Inpatient Hospital Stay (HOSPITAL_COMMUNITY)
Admission: AD | Admit: 2018-02-13 | Discharge: 2018-02-13 | Disposition: A | Discharge status: Home / Self Care | Payer: BLUE CROSS/BLUE SHIELD | Attending: Obstetrics and Gynecology | Admitting: Obstetrics and Gynecology

## 2018-02-13 DIAGNOSIS — R109 Unspecified abdominal pain: Secondary | ICD-10-CM | POA: Diagnosis present

## 2018-02-13 DIAGNOSIS — O26892 Other specified pregnancy related conditions, second trimester: Secondary | ICD-10-CM | POA: Diagnosis not present

## 2018-02-13 DIAGNOSIS — N898 Other specified noninflammatory disorders of vagina: Secondary | ICD-10-CM | POA: Diagnosis not present

## 2018-02-13 DIAGNOSIS — Z3A19 19 weeks gestation of pregnancy: Secondary | ICD-10-CM | POA: Diagnosis not present

## 2018-02-13 LAB — URINALYSIS, ROUTINE W REFLEX MICROSCOPIC
Bilirubin Urine: NEGATIVE
GLUCOSE, UA: NEGATIVE mg/dL
HGB URINE DIPSTICK: NEGATIVE
KETONES UR: NEGATIVE mg/dL
LEUKOCYTES UA: NEGATIVE
Nitrite: NEGATIVE
PROTEIN: NEGATIVE mg/dL
Specific Gravity, Urine: 1.025 (ref 1.005–1.030)
pH: 6 (ref 5.0–8.0)

## 2018-02-13 LAB — WET PREP, GENITAL
CLUE CELLS WET PREP: NONE SEEN
Sperm: NONE SEEN
Trich, Wet Prep: NONE SEEN
YEAST WET PREP: NONE SEEN

## 2018-02-13 NOTE — Discharge Instructions (Signed)
Pregnancy and Sex  Your sex life may change during pregnancy as well as after your newborn arrives. It is normal to have questions about sex during pregnancy. All women are affected differently by pregnancy hormones. You may notice an increase or decrease in your sexual drive throughout your pregnancy. Also, your partner's attitude and sexual drive may change. Share the information in this document with your partner. Talk openly about how you feel about sex.  When is it safe to have sex during pregnancy?  Sex is generally considered safe throughout a normal low-risk pregnancy. Remember:   The fetus is protected by the uterus and the fluid-filled sac that surrounds the fetus (amniotic sac).   The cervix is closed or sealed during pregnancy.   The penis does not reach or harm the fetus during sex.   Sex and orgasms are not thought to cause miscarriages or early labor.   If you use lubricants, use a water-soluble product.  What risk factors make it unsafe to have sex while pregnant?  The following complications or risk factors may make it necessary to limit sexual activity:   You have a history of miscarriage or preterm labor.   You have bleeding, discharge, fluid leakage, or contractions.   Your placenta may be partially covering or completely covering the opening to the cervix (placenta previa).   Your cervix is weak and opens easily (incompetent cervix).   Your partner has an STD (sexually transmitted disease). Avoid sex with the infected person or use a condom to prevent infection to the fetus.   You are unsure of your partner's sexual history. Avoid sex or use condoms.   You are having twins, triples, or other multiples.  Your health care provider will help you determine whether sex during your pregnancy is safe.  What practices are unsafe?   If you engage in oral sex, you should avoid having your partner blow air into your vagina. Although very rare, this can send a dangerous air bubble into your  bloodstream.   Anal sex is generally safe during pregnancy, but there can be a risk of spreading bacteria from the rectum and aggravating any hemorrhoids.  This information is not intended to replace advice given to you by your health care provider. Make sure you discuss any questions you have with your health care provider.  Document Released: 06/30/2009 Document Revised: 09/08/2015 Document Reviewed: 07/02/2015  Elsevier Interactive Patient Education  2019 [employer]

## 2018-02-13 NOTE — MAU Provider Note (Signed)
History     CSN: [id number]  Arrival date and time: 02/13/18 1224   First Provider Initiated Contact with Patient 02/13/18 1606      Chief Complaint  Patient presents with  . Abdominal Pain  . Vaginal Discharge   Caitlin Rhodes is a 28 y.o. [id number] at [redacted]w[redacted]d who presents for Abdominal Pain and Vaginal Discharge.  Patient states she went to the bathroom and noticed some "clearish white" mucoid discharge with wiping around 1130.  She states she has had constant cramping since yesterday and has not tried any OTC medications.  She states that she feels her stomach tightening, which makes the cramping worse, but when the tightening subsides the cramping remains but of a milder intensity.  She denies issues with urination, constipation, or diarrhea.  She denies changes or concerns with vaginal discharge other than the incident earlier. She endorses sexual activity in the last 48 hours that was without protection.       OB History    Gravida  5   Para  3   Term  3   Preterm  0   AB  1   Living  3     SAB  1   TAB  0   Ectopic  0   Multiple  0   Live Births  3           Past Medical History:  Diagnosis Date  . Seizures (HCC)     Past Surgical History:  Procedure Laterality Date  . NO PAST SURGERIES      Family History  Problem Relation Age of Onset  . Cancer Maternal Grandmother   . Cancer Paternal Grandmother     Social History   Tobacco Use  . Smoking status: Never Smoker  . Smokeless tobacco: Never Used  Substance Use Topics  . Alcohol use: Not Currently  . Drug use: No    Allergies: No Known Allergies  Medications Prior to Admission  Medication Sig Dispense Refill Last Dose  . Probiotic Product (PROBIOTIC PO) Take by mouth.   12/03/2017 at Unknown time    Review of Systems  Constitutional: Negative for chills and fever.  Gastrointestinal: Positive for abdominal pain. Negative for constipation, diarrhea, nausea and vomiting.   Genitourinary: Negative for dyspareunia, dysuria, pelvic pain and vaginal bleeding.  Musculoskeletal: Positive for back pain (Lower back x 3 days).   Physical Exam   Blood pressure 111/64, pulse 72, temperature 98.8 F (37.1 C), temperature source Oral, resp. rate 16, weight 64.1 kg, last menstrual period 09/27/2017, SpO2 100 %.  Physical Exam  Constitutional: She is oriented to person, place, and time. She appears well-developed and well-nourished. No distress.  HENT:  Head: Normocephalic and atraumatic.  Eyes: Conjunctivae are normal.  Neck: Normal range of motion.  Cardiovascular: Normal rate.  Respiratory: Effort normal.  GI: Soft.  Genitourinary: Cervix exhibits no motion tenderness, no discharge and no friability.    Vaginal discharge present.     No vaginal bleeding.  No bleeding in the vagina.    Genitourinary Comments: Speculum Exam: -Vaginal Vault: Pink mucosa, Moderate amt clear white mucoid discharge in posterior fornix -wet prep collected -Cervix: Pink, no lesions, cysts, or polyps.  Appears closed. No active bleeding or discharge from os-GC/CT collected -Bimanual Exam: Closed/Long/Thick Uterus: Gravid ~20wks    Musculoskeletal: Normal range of motion.  Neurological: She is alert and oriented to person, place, and time.  Skin: Skin is warm and dry.    MAU Course  Procedures Results for orders placed or performed during the hospital encounter of 02/13/18 (from the past 24 hour(s))  Urinalysis, Routine w reflex microscopic     Status: None   Collection Time: 02/13/18  1:49 PM  Result Value Ref Range   Color, Urine YELLOW YELLOW   APPearance CLEAR CLEAR   Specific Gravity, Urine 1.025 1.005 - 1.030   pH 6.0 5.0 - 8.0   Glucose, UA NEGATIVE NEGATIVE mg/dL   Hgb urine dipstick NEGATIVE NEGATIVE   Bilirubin Urine NEGATIVE NEGATIVE   Ketones, ur NEGATIVE NEGATIVE mg/dL   Protein, ur NEGATIVE NEGATIVE mg/dL   Nitrite NEGATIVE NEGATIVE   Leukocytes, UA NEGATIVE  NEGATIVE  Wet prep, genital     Status: Abnormal   Collection Time: 02/13/18  4:25 PM  Result Value Ref Range   Yeast Wet Prep HPF POC NONE SEEN NONE SEEN   Trich, Wet Prep NONE SEEN NONE SEEN   Clue Cells Wet Prep HPF POC NONE SEEN NONE SEEN   WBC, Wet Prep HPF POC MODERATE (A) NONE SEEN   Sperm NONE SEEN     MDM Pelvic Exam with cultures Labs: UA, Wet prep, and GC/CT   Assessment and Plan  IUP at 19.6wks Vaginal Discharge  -Exam findings discussed -Informed that discharge was most likely sperm from recent sexual encounter. -Discussed sex and pregnancy including contractions and/or cramping, vaginal discharge, and self-limiting vaginal bleeding -Labs Pending -Will await results   Follow Up (4:57 PM) Leukorrhea  -Wet prep returns with moderate WBCs, otherwise normal -Results discussed with patient -PTL and Bleeding Precautions given -GC/CT Pending -Keep appt as scheduled: Feb 22, 2018 -Encouraged to call or return to MAU if symptoms worsen or with the onset of new symptoms. -Discharged to home in stable condition   Cherre RobinsJessica L Daesha Insco MSN, CNM 02/13/2018, 4:06 PM

## 2018-02-13 NOTE — MAU Note (Signed)
When she went to the bathroom earlier, there was a very thick and long mucous that was coming out and she has been cramping since yesterday.

## 2018-02-15 LAB — GC/CHLAMYDIA PROBE AMP (~~LOC~~) NOT AT ARMC
CHLAMYDIA, DNA PROBE: NEGATIVE
NEISSERIA GONORRHEA: NEGATIVE

## 2018-02-19 ENCOUNTER — Encounter (HOSPITAL_COMMUNITY): Payer: Self-pay

## 2018-02-21 ENCOUNTER — Encounter: Payer: Self-pay | Admitting: Obstetrics and Gynecology

## 2018-02-22 ENCOUNTER — Encounter: Payer: Self-pay | Admitting: Obstetrics and Gynecology

## 2018-02-22 ENCOUNTER — Ambulatory Visit (INDEPENDENT_AMBULATORY_CARE_PROVIDER_SITE_OTHER): Payer: BLUE CROSS/BLUE SHIELD | Admitting: Obstetrics and Gynecology

## 2018-02-22 VITALS — BP 107/74 | HR 82 | Wt 141.0 lb

## 2018-02-22 DIAGNOSIS — Z3490 Encounter for supervision of normal pregnancy, unspecified, unspecified trimester: Secondary | ICD-10-CM | POA: Insufficient documentation

## 2018-02-22 DIAGNOSIS — G40909 Epilepsy, unspecified, not intractable, without status epilepticus: Secondary | ICD-10-CM

## 2018-02-22 DIAGNOSIS — Z3492 Encounter for supervision of normal pregnancy, unspecified, second trimester: Secondary | ICD-10-CM

## 2018-02-22 NOTE — Progress Notes (Signed)
Subjective:  Caitlin Rhodes is a 28 y.o. [id number] at [redacted]w[redacted]d being seen today for ongoing prenatal care.Transferred from [facility]. OB records in media. TSVD x 3. 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   She is currently monitored for the following issues for this low-risk pregnancy and has Seizure disorder [facility]) and Supervision of normal pregnancy, antepartum on their problem list.  Patient reports no complaints.   . Vag. Bleeding: None.  Movement: Present. Denies leaking of fluid.   The following portions of the patient's history were reviewed and updated as appropriate: allergies, current medications, past family history, past medical history, past social history, past surgical history and problem list. Problem list updated.  Objective:   Vitals:   02/22/18 1005  BP: 107/74  Pulse: 82  Weight: 141 lb (64 kg)    Fetal Status: Fetal Heart Rate (bpm): 144 Fundal Height: 21 cm Movement: Present     General:  Alert, oriented and cooperative. Patient is in no acute distress.  Skin: Skin is warm and dry. No rash noted.   Cardiovascular: Normal heart rate noted  Respiratory: Normal respiratory effort, no problems with respiration noted  Abdomen: Soft, gravid, appropriate for gestational age. Pain/Pressure: Absent     Pelvic:  Cervical exam deferred        Extremities: Normal range of motion.  Edema: None  Mental Status: Normal mood and affect. Normal behavior. Normal judgment and thought content.   Urinalysis:      Assessment and Plan:  Pregnancy: [id number] at [redacted]w[redacted]d  1. Encounter for supervision of normal pregnancy, antepartum, unspecified gravidity Stable Prenatal care reviewed with pt Declined flu vaccine - [facility] AMB BABYSCRIPTS OPT IN - [place] MFM OB DETAIL +14 WK; Future  2. Seizure disorder (HCC) Stable No meds  Preterm labor symptoms and general obstetric precautions including but not limited to vaginal bleeding, contractions, leaking of fluid and fetal movement were  reviewed in detail with the patient. Please refer to After Visit Summary for other counseling recommendations.  Return in about 4 weeks (around 03/22/2018) for OB visit.   Hermina Staggers, MD

## 2018-02-22 NOTE — Patient Instructions (Signed)

## 2018-02-22 NOTE — Progress Notes (Signed)
  Last pap: 08/24/2017 WNL   Had seizure during labor in 2011  Pt states last seizure was 2012.

## 2018-02-26 ENCOUNTER — Encounter (HOSPITAL_COMMUNITY): Payer: Self-pay

## 2018-03-06 ENCOUNTER — Ambulatory Visit (HOSPITAL_COMMUNITY)
Admission: RE | Admit: 2018-03-06 | Discharge: 2018-03-06 | Disposition: A | Discharge status: Home / Self Care | Payer: BLUE CROSS/BLUE SHIELD | Source: Ambulatory Visit | Attending: Obstetrics and Gynecology | Admitting: Obstetrics and Gynecology

## 2018-03-06 ENCOUNTER — Encounter (HOSPITAL_COMMUNITY): Payer: Self-pay

## 2018-03-06 DIAGNOSIS — O283 Abnormal ultrasonic finding on antenatal screening of mother: Secondary | ICD-10-CM

## 2018-03-06 DIAGNOSIS — O99352 Diseases of the nervous system complicating pregnancy, second trimester: Secondary | ICD-10-CM | POA: Diagnosis not present

## 2018-03-06 DIAGNOSIS — Z3A22 22 weeks gestation of pregnancy: Secondary | ICD-10-CM

## 2018-03-06 DIAGNOSIS — Z3490 Encounter for supervision of normal pregnancy, unspecified, unspecified trimester: Secondary | ICD-10-CM | POA: Diagnosis not present

## 2018-03-06 DIAGNOSIS — G40909 Epilepsy, unspecified, not intractable, without status epilepticus: Secondary | ICD-10-CM | POA: Diagnosis not present

## 2018-03-06 DIAGNOSIS — O358XX0 Maternal care for other (suspected) fetal abnormality and damage, not applicable or unspecified: Secondary | ICD-10-CM | POA: Diagnosis not present

## 2018-03-07 ENCOUNTER — Other Ambulatory Visit (HOSPITAL_COMMUNITY): Payer: Self-pay | Admitting: *Deleted

## 2018-03-07 DIAGNOSIS — Z362 Encounter for other antenatal screening follow-up: Secondary | ICD-10-CM

## 2018-03-08 ENCOUNTER — Encounter (HOSPITAL_COMMUNITY): Payer: Self-pay

## 2018-03-08 ENCOUNTER — Ambulatory Visit (HOSPITAL_COMMUNITY)
Admission: EM | Admit: 2018-03-08 | Discharge: 2018-03-08 | Disposition: A | Discharge status: Home / Self Care | Payer: BLUE CROSS/BLUE SHIELD | Attending: Family Medicine | Admitting: Family Medicine

## 2018-03-08 DIAGNOSIS — J069 Acute upper respiratory infection, unspecified: Secondary | ICD-10-CM

## 2018-03-08 DIAGNOSIS — B9789 Other viral agents as the cause of diseases classified elsewhere: Secondary | ICD-10-CM

## 2018-03-08 NOTE — Discharge Instructions (Addendum)
Most likely viral URI I am giving you a list of medications that are safe in pregnancy for your symptoms.  Follow up as needed for continued or worsening symptoms

## 2018-03-08 NOTE — ED Provider Notes (Signed)
[facility]    CSN: [id number] Arrival date & time: 03/08/18  1416     History   Chief Complaint Chief Complaint  Patient presents with  . Cough  . Nasal Congestion  . Generalized Body Aches    HPI Caitlin Rhodes is a 28 y.o. female.   Pt is a 28 year old female that presents with 2 days of URI symptoms.    URI  Presenting symptoms: congestion, cough, facial pain and rhinorrhea   Severity:  Moderate Duration:  2 days Timing:  Constant Progression:  Unchanged Chronicity:  New Relieved by: tylenol. Worsened by:  Nothing Associated symptoms: headaches, myalgias and swollen glands   Associated symptoms: no neck pain, no sinus pain and no sneezing   Risk factors: sick contacts   Risk factors: no immunosuppression, no recent illness and no recent travel     Past Medical History:  Diagnosis Date  . Seizures [facility](HCC)     Patient Active Problem List   Diagnosis Date Noted  . Supervision of normal pregnancy, antepartum 02/22/2018  . Seizure disorder (HCC) 09/01/2012    Past Surgical History:  Procedure Laterality Date  . NO PAST SURGERIES      OB History    Gravida  5   Para  3   Term  3   Preterm  0   AB  1   Living  3     SAB  1   TAB  0   Ectopic  0   Multiple  0   Live Births  3            Home Medications    Prior to Admission medications   Medication Sig Start Date End Date Taking? Authorizing Provider  Probiotic Product (PROBIOTIC PO) Take by mouth.    [provider]    Family History Family History  Problem Relation Age of Onset  . Cancer Maternal Grandmother   . Cancer Paternal Grandmother     Social History Social History   Tobacco Use  . Smoking status: Never Smoker  . Smokeless tobacco: Never Used  Substance Use Topics  . Alcohol use: Not Currently  . Drug use: No     Allergies   Patient has no known allergies.   Review of Systems Review of Systems  HENT: Positive for  congestion and rhinorrhea. Negative for sinus pain and sneezing.   Respiratory: Positive for cough.   Musculoskeletal: Positive for myalgias. Negative for neck pain.  Neurological: Positive for headaches.     Physical Exam Triage Vital Signs ED Triage Vitals  Enc Vitals Group     BP 03/08/18 1454 104/71     Pulse Rate 03/08/18 1454 95     Resp 03/08/18 1454 20     Temp 03/08/18 1454 98.2 F (36.8 C)     Temp Source 03/08/18 1454 Oral     SpO2 03/08/18 1454 100 %     Weight --      Height --      Head Circumference --      Peak Flow --      Pain Score 03/08/18 1455 0     Pain Loc --      Pain Edu? --      Excl. in GC? --    No data found.  Updated Vital Signs BP 104/71 (BP Location: Right Arm)   Pulse 95   Temp 98.2 F (36.8 C) (Oral)   Resp 20  LMP 09/27/2017 (Exact Date)   SpO2 100%   Visual Acuity Right Eye Distance:   Left Eye Distance:   Bilateral Distance:    Right Eye Near:   Left Eye Near:    Bilateral Near:     Physical Exam Constitutional:      General: She is not in acute distress.    Appearance: Normal appearance. She is not toxic-appearing.  HENT:     Head: Normocephalic and atraumatic.     Right Ear: Tympanic membrane and ear canal normal.     Left Ear: Tympanic membrane and ear canal normal.     Nose: Congestion and rhinorrhea present.     Mouth/Throat:     Pharynx: Oropharynx is clear.  Eyes:     Conjunctiva/sclera: Conjunctivae normal.  Neck:     Musculoskeletal: Normal range of motion.  Cardiovascular:     Rate and Rhythm: Normal rate and regular rhythm.  Pulmonary:     Effort: Pulmonary effort is normal.     Breath sounds: Normal breath sounds.  Musculoskeletal: Normal range of motion.  Lymphadenopathy:     Cervical: No cervical adenopathy.  Skin:    General: Skin is warm and dry.  Neurological:     Mental Status: She is alert.  Psychiatric:        Mood and Affect: Mood normal.      UC Treatments / Results  Labs (all  labs ordered are listed, but only abnormal results are displayed) Labs Reviewed - No data to display  EKG None  Radiology No results found.  Procedures Procedures (including critical care time)  Medications Ordered in UC Medications - No data to display  Initial Impression / Assessment and Plan / UC Course  I have reviewed the triage vital signs and the nursing notes.  Pertinent labs & imaging results that were available during my care of the patient were reviewed by me and considered in my medical decision making (see chart for details).     Symptoms consistent with a viral URI Patient is [redacted] weeks pregnant List of medications that are safe in pregnancy given with highlighted medications that are recommended for symptoms Follow up as needed for continued or worsening symptoms  Final Clinical Impressions(s) / UC Diagnoses   Final diagnoses:  Viral URI with cough     Discharge Instructions     Most likely viral URI I am giving you a list of medications that are safe in pregnancy for your symptoms.  Follow up as needed for continued or worsening symptoms     ED Prescriptions    None     Controlled Substance Prescriptions [place] Controlled Substance Registry consulted? Not Applicable   Janace Aris, NP 03/08/18 1604

## 2018-03-08 NOTE — ED Triage Notes (Addendum)
Pt present coughing, body aches, nasal congestion. Symptoms started 2 days ago. Pt is currently pregnant.  Pt has tried OTC medication with no relief

## 2018-03-11 ENCOUNTER — Inpatient Hospital Stay (HOSPITAL_COMMUNITY)
Admission: AD | Admit: 2018-03-11 | Discharge: 2018-03-11 | Disposition: A | Discharge status: Home / Self Care | Payer: BLUE CROSS/BLUE SHIELD | Source: Ambulatory Visit | Attending: Obstetrics & Gynecology | Admitting: Obstetrics & Gynecology

## 2018-03-11 ENCOUNTER — Encounter (HOSPITAL_COMMUNITY): Payer: Self-pay | Admitting: *Deleted

## 2018-03-11 DIAGNOSIS — O26892 Other specified pregnancy related conditions, second trimester: Secondary | ICD-10-CM | POA: Diagnosis not present

## 2018-03-11 DIAGNOSIS — Z3A22 22 weeks gestation of pregnancy: Secondary | ICD-10-CM | POA: Insufficient documentation

## 2018-03-11 DIAGNOSIS — N898 Other specified noninflammatory disorders of vagina: Secondary | ICD-10-CM

## 2018-03-11 DIAGNOSIS — O9989 Other specified diseases and conditions complicating pregnancy, childbirth and the puerperium: Secondary | ICD-10-CM | POA: Diagnosis not present

## 2018-03-11 DIAGNOSIS — Z0371 Encounter for suspected problem with amniotic cavity and membrane ruled out: Secondary | ICD-10-CM | POA: Diagnosis not present

## 2018-03-11 LAB — WET PREP, GENITAL
Clue Cells Wet Prep HPF POC: NONE SEEN
Sperm: NONE SEEN
Trich, Wet Prep: NONE SEEN
Yeast Wet Prep HPF POC: NONE SEEN

## 2018-03-11 LAB — URINALYSIS, ROUTINE W REFLEX MICROSCOPIC
Glucose, UA: NEGATIVE mg/dL
Hgb urine dipstick: NEGATIVE
Ketones, ur: 15 mg/dL — AB
Leukocytes,Ua: NEGATIVE
Nitrite: NEGATIVE
PH: 5.5 (ref 5.0–8.0)
Protein, ur: 30 mg/dL — AB
SPECIFIC GRAVITY, URINE: 1.02 (ref 1.005–1.030)

## 2018-03-11 LAB — URINALYSIS, MICROSCOPIC (REFLEX)

## 2018-03-11 LAB — AMNISURE RUPTURE OF MEMBRANE (ROM) NOT AT ARMC: Amnisure ROM: NEGATIVE

## 2018-03-11 LAB — POCT FERN TEST: POCT Fern Test: NEGATIVE

## 2018-03-11 NOTE — MAU Note (Signed)
Caitlin Rhodes is a 28 y.o. at [redacted]w[redacted]d here in MAU reporting: states she has been leaking something for a few days, unsure if it is discharge or SROM. States it is clear and watery with no odor. No bleeding. No pain. Reports fetal movement.  Onset of complaint: Wednesday   Pain score: 0/10  Vitals:   03/11/18 1604  BP: 97/66  Pulse: 85  Resp: 18  Temp: 97.9 F (36.6 C)  SpO2: 100%      Lab orders placed from triage: UA

## 2018-03-11 NOTE — MAU Provider Note (Signed)
History     CSN: [id number]  Arrival date and time: 03/11/18 1540   First Provider Initiated Contact with Patient 03/11/18 1714      Chief Complaint  Patient presents with  . Vaginal Discharge   HPI  Ms.  Caitlin Rhodes is a 28 y.o. year old [id number] female at [redacted]w[redacted]d weeks gestation who presents to MAU reporting leaking for a few days, unsure if SROM or vaginal d/c, clear & watery d/c with no odor. She denies VB or pain. She reports (+) FM.   Past Medical History:  Diagnosis Date  . Seizures (HCC)     Past Surgical History:  Procedure Laterality Date  . NO PAST SURGERIES      Family History  Problem Relation Age of Onset  . Cancer Maternal Grandmother   . Cancer Paternal Grandmother     Social History   Tobacco Use  . Smoking status: Never Smoker  . Smokeless tobacco: Never Used  Substance Use Topics  . Alcohol use: Not Currently  . Drug use: No    Allergies: No Known Allergies  Medications Prior to Admission  Medication Sig Dispense Refill Last Dose  . Probiotic Product (PROBIOTIC PO) Take by mouth.   Taking    Review of Systems  Constitutional: Negative.   HENT: Negative.   Eyes: Negative.   Respiratory: Negative.   Cardiovascular: Negative.   Gastrointestinal: Negative.   Endocrine: Negative.   Genitourinary: Positive for vaginal discharge ("leaking past few days"). Negative for pelvic pain and vaginal bleeding.  Musculoskeletal: Negative.   Skin: Negative.   Allergic/Immunologic: Negative.   Neurological: Negative.   Hematological: Negative.   Psychiatric/Behavioral: Negative.    Physical Exam   Blood pressure 97/66, pulse 85, temperature 97.9 F (36.6 C), temperature source Oral, resp. rate 18, height 5\' 7"  (1.702 m), weight 62.5 kg, last menstrual period 09/27/2017, SpO2 100 %.  Physical Exam  Nursing note and vitals reviewed. Constitutional: She is oriented to person, place, and time. She appears well-developed and  well-nourished.  HENT:  Head: Normocephalic and atraumatic.  Eyes: Pupils are equal, round, and reactive to light.  Neck: Normal range of motion.  Cardiovascular: Normal rate.  Respiratory: Effort normal.  GI: Soft.  Genitourinary:    Genitourinary Comments: Uterus: gravid, S=D, SE: cervix is smooth, pink, no lesions, small amt of thick, white vaginal d/c -- WP, Amnisure done, closed/long/firm, no CMT or friability, no adnexal tenderness    Musculoskeletal: Normal range of motion.  Neurological: She is alert and oriented to person, place, and time.  Skin: Skin is warm and dry.  Psychiatric: She has a normal mood and affect. Her behavior is normal. Judgment and thought content normal.    MAU Course  Procedures  MDM CCUA Wet Prep GC/CT -- pending FHTs by doppler: 150 bpm   Results for orders placed or performed during the hospital encounter of 03/11/18 (from the past 24 hour(s))  Urinalysis, Routine w reflex microscopic     Status: Abnormal   Collection Time: 03/11/18  4:06 PM  Result Value Ref Range   Color, Urine YELLOW YELLOW   APPearance CLEAR CLEAR   Specific Gravity, Urine 1.020 1.005 - 1.030   pH 5.5 5.0 - 8.0   Glucose, UA NEGATIVE NEGATIVE mg/dL   Hgb urine dipstick NEGATIVE NEGATIVE   Bilirubin Urine SMALL (A) NEGATIVE   Ketones, ur 15 (A) NEGATIVE mg/dL   Protein, ur 30 (A) NEGATIVE mg/dL   Nitrite NEGATIVE NEGATIVE   Leukocytes,Ua NEGATIVE  NEGATIVE  Urinalysis, Microscopic (reflex)     Status: Abnormal   Collection Time: 03/11/18  4:06 PM  Result Value Ref Range   RBC / HPF 0-5 0 - 5 RBC/hpf   WBC, UA 0-5 0 - 5 WBC/hpf   Bacteria, UA FEW (A) NONE SEEN   Squamous Epithelial / LPF 0-5 0 - 5   Mucus PRESENT   Amnisure rupture of membrane (rom)not at [facility]     Status: None   Collection Time: 03/11/18  5:28 PM  Result Value Ref Range   Amnisure ROM NEGATIVE   Wet prep, genital     Status: Abnormal   Collection Time: 03/11/18  5:36 PM  Result Value Ref Range    Yeast Wet Prep HPF POC NONE SEEN NONE SEEN   Trich, Wet Prep NONE SEEN NONE SEEN   Clue Cells Wet Prep HPF POC NONE SEEN NONE SEEN   WBC, Wet Prep HPF POC FEW (A) NONE SEEN   Sperm NONE SEEN     Assessment and Plan  No leakage of amniotic fluid into vagina - Plan: Discharge patient - Reassurance given that membranes are not ruptured and the d/c she is having appears to be normal for this gestation - Information provided on preventing preterm birth - Keep scheduled appt with [facility] on 03/19/2018 - Patient verbalized an understanding of the plan of care and agrees.   Raelyn Mora, MSN, CNM 03/11/2018, 5:14 PM

## 2018-03-16 ENCOUNTER — Other Ambulatory Visit (HOSPITAL_COMMUNITY): Payer: Self-pay | Admitting: Obstetrics and Gynecology

## 2018-03-20 ENCOUNTER — Telehealth (HOSPITAL_COMMUNITY): Payer: Self-pay | Admitting: *Deleted

## 2018-03-20 ENCOUNTER — Telehealth: Payer: Self-pay

## 2018-03-20 NOTE — Telephone Encounter (Signed)
Pt returned call, name and DOB verified.  Low risk panorama result given.  Pt very happy with results.

## 2018-03-20 NOTE — Telephone Encounter (Signed)
Patient sent message in mycharts requesting Panorama lab results drawn by MFM on 03/06/18.  Responded to patient advising to use guidelines for accessing information on panorama website. Also, contacted [facility] - [facility] requesting status of lab results.

## 2018-03-22 ENCOUNTER — Encounter: Payer: Self-pay | Admitting: Obstetrics and Gynecology

## 2018-03-22 ENCOUNTER — Ambulatory Visit (INDEPENDENT_AMBULATORY_CARE_PROVIDER_SITE_OTHER): Payer: BLUE CROSS/BLUE SHIELD | Admitting: Obstetrics and Gynecology

## 2018-03-22 VITALS — BP 93/61 | HR 78 | Wt 145.6 lb

## 2018-03-22 DIAGNOSIS — O283 Abnormal ultrasonic finding on antenatal screening of mother: Secondary | ICD-10-CM | POA: Insufficient documentation

## 2018-03-22 DIAGNOSIS — Z3492 Encounter for supervision of normal pregnancy, unspecified, second trimester: Secondary | ICD-10-CM

## 2018-03-22 DIAGNOSIS — Z3A24 24 weeks gestation of pregnancy: Secondary | ICD-10-CM

## 2018-03-22 DIAGNOSIS — G40909 Epilepsy, unspecified, not intractable, without status epilepticus: Secondary | ICD-10-CM

## 2018-03-22 DIAGNOSIS — Z3490 Encounter for supervision of normal pregnancy, unspecified, unspecified trimester: Secondary | ICD-10-CM

## 2018-03-22 NOTE — Progress Notes (Signed)
   PRENATAL VISIT NOTE  Subjective:  Caitlin Rhodes is a 28 y.o. G5P3013 at [redacted]w[redacted]d being seen today for ongoing prenatal care.  She is currently monitored for the following issues for this high-risk pregnancy and has Seizure disorder (HCC); Supervision of normal pregnancy, antepartum; No leakage of amniotic fluid into vagina; and Abnormal fetal ultrasound on their problem list.  Patient reports no complaints.  Contractions: Not present. Vag. Bleeding: None.  Movement: Present. Denies leaking of fluid.   The following portions of the patient's history were reviewed and updated as appropriate: allergies, current medications, past family history, past medical history, past social history, past surgical history and problem list. Problem list updated.  Objective:   Vitals:   03/22/18 1102  BP: 93/61  Pulse: 78  Weight: 145 lb 9.6 oz (66 kg)   Fetal Status: Fetal Heart Rate (bpm): 145   Movement: Present     General:  Alert, oriented and cooperative. Patient is in no acute distress.  Skin: Skin is warm and dry. No rash noted.   Cardiovascular: Normal heart rate noted  Respiratory: Normal respiratory effort, no problems with respiration noted  Abdomen: Soft, gravid, appropriate for gestational age.  Pain/Pressure: Absent     Pelvic: Cervical exam deferred        Extremities: Normal range of motion.  Edema: None  Mental Status: Normal mood and affect. Normal behavior. Normal judgment and thought content.   Assessment and Plan:  Pregnancy: G5P3013 at [redacted]w[redacted]d  1. Encounter for supervision of normal pregnancy, antepartum, unspecified gravidity Doing well, no issues  2. Seizure disorder (HCC) - Per records, Had seizure during labor with 2nd pregnancy but neg for PEC - no specific diagnosis, per patient, last seizure 6-7 years ago  3. Abnormal fetal ultrasound - reviewed panorama results, [place] results, patient has followup  Preterm labor symptoms and general obstetric precautions  including but not limited to vaginal bleeding, contractions, leaking of fluid and fetal movement were reviewed in detail with the patient. Please refer to After Visit Summary for other counseling recommendations.  Return in about 3 weeks (around 04/12/2018) for 2 hr GTT, 3rd trim labs, OB visit.  Future Appointments  Date Time Provider Department Center  04/04/2018  7:45 AM [facility] [place] 2 [facility]  04/12/2018  8:00 AM [facility] None  04/12/2018  8:15 AM Constant, Gigi Gin, MD [facility] None    Conan Bowens, MD

## 2018-03-28 ENCOUNTER — Other Ambulatory Visit: Payer: Self-pay

## 2018-03-28 DIAGNOSIS — N898 Other specified noninflammatory disorders of vagina: Secondary | ICD-10-CM

## 2018-03-28 MED ORDER — METRONIDAZOLE 500 MG PO TABS: 500.0000 mg | ORAL_TABLET | Freq: Two times a day (BID) | ORAL | 0 refills | Status: DC

## 2018-03-28 NOTE — Progress Notes (Signed)
Pt requesting Rx for BV Rx sent per protocol for BV sx's. Pt notified  Pharmacy confirmed.  Pt advised if no relief then when need evaluation w/ provider.

## 2018-04-04 ENCOUNTER — Encounter (HOSPITAL_COMMUNITY): Payer: Self-pay

## 2018-04-04 ENCOUNTER — Ambulatory Visit (HOSPITAL_COMMUNITY)
Admission: RE | Admit: 2018-04-04 | Discharge: 2018-04-04 | Disposition: A | Discharge status: Home / Self Care | Payer: BLUE CROSS/BLUE SHIELD | Source: Ambulatory Visit | Attending: Obstetrics and Gynecology | Admitting: Obstetrics and Gynecology

## 2018-04-04 ENCOUNTER — Other Ambulatory Visit: Payer: Self-pay

## 2018-04-04 ENCOUNTER — Ambulatory Visit (HOSPITAL_COMMUNITY): Payer: BLUE CROSS/BLUE SHIELD | Admitting: *Deleted

## 2018-04-04 VITALS — BP 105/65 | HR 76 | Wt 139.8 lb

## 2018-04-04 DIAGNOSIS — G40909 Epilepsy, unspecified, not intractable, without status epilepticus: Secondary | ICD-10-CM

## 2018-04-04 DIAGNOSIS — O99352 Diseases of the nervous system complicating pregnancy, second trimester: Secondary | ICD-10-CM

## 2018-04-04 DIAGNOSIS — O358XX0 Maternal care for other (suspected) fetal abnormality and damage, not applicable or unspecified: Secondary | ICD-10-CM

## 2018-04-04 DIAGNOSIS — O359XX0 Maternal care for (suspected) fetal abnormality and damage, unspecified, not applicable or unspecified: Secondary | ICD-10-CM | POA: Diagnosis present

## 2018-04-04 DIAGNOSIS — Z362 Encounter for other antenatal screening follow-up: Secondary | ICD-10-CM

## 2018-04-04 DIAGNOSIS — O283 Abnormal ultrasonic finding on antenatal screening of mother: Secondary | ICD-10-CM

## 2018-04-04 DIAGNOSIS — Z3A26 26 weeks gestation of pregnancy: Secondary | ICD-10-CM

## 2018-04-12 ENCOUNTER — Other Ambulatory Visit: Payer: Self-pay

## 2018-04-12 ENCOUNTER — Other Ambulatory Visit: Payer: BLUE CROSS/BLUE SHIELD

## 2018-04-12 ENCOUNTER — Ambulatory Visit (INDEPENDENT_AMBULATORY_CARE_PROVIDER_SITE_OTHER): Payer: BLUE CROSS/BLUE SHIELD | Admitting: Obstetrics and Gynecology

## 2018-04-12 ENCOUNTER — Encounter: Payer: Self-pay | Admitting: Obstetrics and Gynecology

## 2018-04-12 VITALS — BP 94/64 | HR 88 | Wt 142.3 lb

## 2018-04-12 DIAGNOSIS — G40909 Epilepsy, unspecified, not intractable, without status epilepticus: Secondary | ICD-10-CM

## 2018-04-12 DIAGNOSIS — Z3A27 27 weeks gestation of pregnancy: Secondary | ICD-10-CM

## 2018-04-12 DIAGNOSIS — O26892 Other specified pregnancy related conditions, second trimester: Secondary | ICD-10-CM

## 2018-04-12 DIAGNOSIS — Z3490 Encounter for supervision of normal pregnancy, unspecified, unspecified trimester: Secondary | ICD-10-CM

## 2018-04-12 NOTE — Progress Notes (Signed)
ROB/GTT.  TDAP declined. BTL signed.

## 2018-04-12 NOTE — Progress Notes (Signed)
   PRENATAL VISIT NOTE  Subjective:  Caitlin Rhodes is a 28 y.o. G5P3013 at [redacted]w[redacted]d being seen today for ongoing prenatal care.  She is currently monitored for the following issues for this low-risk pregnancy and has Seizure disorder (HCC); Supervision of normal pregnancy, antepartum; No leakage of amniotic fluid into vagina; and Abnormal fetal ultrasound on their problem list.  Patient reports no complaints.  Contractions: Irritability. Vag. Bleeding: None.  Movement: Present. Denies leaking of fluid.   The following portions of the patient's history were reviewed and updated as appropriate: allergies, current medications, past family history, past medical history, past social history, past surgical history and problem list.   Objective:   Vitals:   04/12/18 0945  BP: 94/64  Pulse: 88  Weight: 142 lb 4.8 oz (64.5 kg)    Fetal Status: Fetal Heart Rate (bpm): 159 Fundal Height: 26 cm Movement: Present     General:  Alert, oriented and cooperative. Patient is in no acute distress.  Skin: Skin is warm and dry. No rash noted.   Cardiovascular: Normal heart rate noted  Respiratory: Normal respiratory effort, no problems with respiration noted  Abdomen: Soft, gravid, appropriate for gestational age.  Pain/Pressure: Present     Pelvic: Cervical exam deferred        Extremities: Normal range of motion.  Edema: None  Mental Status: Normal mood and affect. Normal behavior. Normal judgment and thought content.   Assessment and Plan:  Pregnancy: G5P3013 at [redacted]w[redacted]d 1. Encounter for supervision of normal pregnancy, antepartum, unspecified gravidity Patient is doing well without complaints Patient declined tdap Third trimester labs today with glucola Patient is interested in waterbirth She is considering BTL- btl form signed today as she has Medicaid as a secondary inssurance  2. Seizure disorder (HCC) Stable not on medication Last seizure 6 years ago  Preterm labor symptoms and  general obstetric precautions including but not limited to vaginal bleeding, contractions, leaking of fluid and fetal movement were reviewed in detail with the patient. Please refer to After Visit Summary for other counseling recommendations.   Return in about 2 weeks (around 04/26/2018) for ROB.  Future Appointments  Date Time Provider Department Center  04/19/2018  8:15 AM Sharyon Cable, CNM [facility] None    Catalina Antigua, MD

## 2018-04-12 NOTE — Patient Instructions (Signed)
Considering Waterbirth? Guide for patients at [facility]  Why consider waterbirth?  . Gentle birth for babies . Less pain medicine used in labor . May allow for passive descent/less pushing . May reduce perineal tears  . More mobility and instinctive maternal position changes . Increased maternal relaxation . Reduced blood pressure in labor  Is waterbirth safe? What are the risks of infection, drowning or other complications?  . Infection: o Very low risk (3.7 % for tub vs 4.8% for bed) o 7 in 8000 waterbirths with documented infection o Poorly cleaned equipment most common cause o Slightly lower group B strep transmission rate  . Drowning o Maternal:  - Very low risk   - Related to seizures or fainting o Newborn:  - Very low risk. No evidence of increased risk of respiratory problems in multiple large studies - Physiological protection from breathing under water - Avoid underwater birth if there are any fetal complications - Once baby's head is out of the water, keep it out.  . Birth complication o Some reports of cord trauma, but risk decreased by bringing baby to surface gradually o No evidence of increased risk of shoulder dystocia. Mothers can usually change positions faster in water than in a bed, possibly aiding the maneuvers to free the shoulder.   You must attend a Waterbirth class at [facility]  3rd Wednesday of every month from 7-9pm  Free  Register by calling [phone] or online at [email or web address]  Bring us the certificate from the class to your prenatal appointment  Meet with a midwife at 36 weeks to see if you can still plan a waterbirth and to sign the consent.   If you plan a waterbirth after March 18, 2018, at [facility] at , you will need to purchase the following:  Fish net  Bathing suit top (optional)  Long-handled mirror (optional)  If you plan a waterbirth before  March 18, 2018: Purchase or rent the following supplies: You are responsible for providing all supplies listed above. **If you do not have all necessary supplies you cannot have a waterbirth.**   Water Birth Pool (Birth Pool in a Box or [employer] for instance)  (Tubs start ~$125)  Single-use disposable tub liner designed for your brand of tub  Electric drain pump to remove water (We recommend 792 gallon per hour or greater pump.)   New garden hose labeled "lead-free", "suitable for drinking water",  Separate garden hose to remove the dirty water  Fish net  Bathing suit top (optional)  Long-handled mirror (optional)  Places to purchase or rent supplies:   Yourwaterbirth.com for tub purchases and supplies  Waterbirthsolutions.com for tub purchases and supplies  [employer] (www.thelaborladies.com) $275 for tub rental/set-up & take down/kit   [place] [employer] (http://www.padanc.org/MeetUs.[email or web address]) Information regarding doulas (labor support) who provide pool rentals  Things that would prevent you from having a waterbirth:  Premature, <37wks  Previous cesarean birth  Presence of thick meconium-stained fluid  Multiple gestation (Twins, triplets, etc.)  Uncontrolled diabetes or gestational diabetes requiring medication  Hypertension requiring medication or diagnosis of pre-eclampsia  Heavy vaginal bleeding  Non-reassuring fetal heart rate  Active infection (MRSA, etc.). Group B Strep is NOT a contraindication for waterbirth.  If your labor has to be induced and induction method requires continuous monitoring of the baby's heart rate  Other risks/issues identified by your obstetrical provider  Please remember that birth is unpredictable. Under certain unforeseeable circumstances your   provider may advise against giving birth in the tub. These decisions will be made on a case-by-case basis and with the safety of you and your baby as our highest  priority.    

## 2018-04-13 LAB — CBC
HEMATOCRIT: 35.3 % (ref 34.0–46.6)
Hemoglobin: 12.1 g/dL (ref 11.1–15.9)
MCH: 31 pg (ref 26.6–33.0)
MCHC: 34.3 g/dL (ref 31.5–35.7)
MCV: 91 fL (ref 79–97)
Platelets: 245 10*3/uL (ref 150–450)
RBC: 3.9 x10E6/uL (ref 3.77–5.28)
RDW: 13.2 % (ref 11.7–15.4)
WBC: 8.8 10*3/uL (ref 3.4–10.8)

## 2018-04-13 LAB — RPR: RPR: NONREACTIVE

## 2018-04-13 LAB — GLUCOSE TOLERANCE, 2 HOURS W/ 1HR
Glucose, 1 hour: 110 mg/dL (ref 65–179)
Glucose, 2 hour: 100 mg/dL (ref 65–152)
Glucose, Fasting: 75 mg/dL (ref 65–91)

## 2018-04-13 LAB — HIV ANTIBODY (ROUTINE TESTING W REFLEX): HIV Screen 4th Generation wRfx: NONREACTIVE

## 2018-04-19 ENCOUNTER — Encounter: Payer: Self-pay | Admitting: Certified Nurse Midwife

## 2018-04-19 ENCOUNTER — Other Ambulatory Visit: Payer: Self-pay

## 2018-04-19 ENCOUNTER — Ambulatory Visit (INDEPENDENT_AMBULATORY_CARE_PROVIDER_SITE_OTHER): Payer: BLUE CROSS/BLUE SHIELD | Admitting: Certified Nurse Midwife

## 2018-04-19 DIAGNOSIS — Z3A28 28 weeks gestation of pregnancy: Secondary | ICD-10-CM

## 2018-04-19 DIAGNOSIS — Z3480 Encounter for supervision of other normal pregnancy, unspecified trimester: Secondary | ICD-10-CM

## 2018-04-19 DIAGNOSIS — G40909 Epilepsy, unspecified, not intractable, without status epilepticus: Secondary | ICD-10-CM

## 2018-04-19 DIAGNOSIS — O99353 Diseases of the nervous system complicating pregnancy, third trimester: Secondary | ICD-10-CM

## 2018-04-19 NOTE — Progress Notes (Signed)
   TELEHEALTH VIRTUAL OBSTETRICS VISIT ENCOUNTER NOTE  I connected with Caitlin Rhodes on 04/19/18 at  8:18 AM EDT by telephone at home and verified that I am speaking with the correct person using two identifiers.   I discussed the limitations, risks, security and privacy concerns of performing an evaluation and management service by telephone and the availability of in person appointments. I also discussed with the patient that there may be a patient responsible charge related to this service. The patient expressed understanding and agreed to proceed.  Subjective:  Caitlin Rhodes is a 28 y.o. G5P3013 at [redacted]w[redacted]d being followed for ongoing prenatal care.  She is currently monitored for the following issues for this low-risk pregnancy and has Seizure disorder (HCC); Supervision of normal pregnancy, antepartum; No leakage of amniotic fluid into vagina; and Abnormal fetal ultrasound on their problem list.  Patient reports no complaints. Reports fetal movement. Denies any contractions, bleeding or leaking of fluid.   The following portions of the patient's history were reviewed and updated as appropriate: allergies, current medications, past family history, past medical history, past social history, past surgical history and problem list.   Objective:   General:  Alert, oriented and cooperative.   Mental Status: Normal mood and affect perceived. Normal judgment and thought content.  Rest of physical exam deferred due to type of encounter  Assessment and Plan:  Pregnancy: G5P3013 at [redacted]w[redacted]d 1. Supervision of other normal pregnancy, antepartum - Patient doing well, no complaints - Reviewed results of third trimester labs and GTT with patient, normal results  - Patient interested in waterbirth, discussed online classes with patient and going to [email or web address] to be able to sign up for virtual class  - Educated and encouraged to take BP once a week with at home cuff and to  notify office of abnormal results  - Anticipatory guidance on upcoming prenatal appointments   2. Seizure disorder (HCC) - Hx of seizures, not on medication- last seizure 2011  Preterm labor symptoms and general obstetric precautions including but not limited to vaginal bleeding, contractions, leaking of fluid and fetal movement were reviewed in detail with the patient.  I discussed the assessment and treatment plan with the patient. The patient was provided an opportunity to ask questions and all were answered. The patient agreed with the plan and demonstrated an understanding of the instructions. The patient was advised to call back or seek an in-person office evaluation/go to MAU at [facility] for any urgent or concerning symptoms. Please refer to After Visit Summary for other counseling recommendations.  On phone with patient for 7 minutes   Return in about 2 weeks (around 05/03/2018) for TELEVISIT- ROB.  Sharyon Cable, CNM [facility] [employer], [facility]

## 2018-04-26 ENCOUNTER — Other Ambulatory Visit: Payer: Self-pay

## 2018-04-26 ENCOUNTER — Inpatient Hospital Stay (HOSPITAL_COMMUNITY)
Admission: AD | Admit: 2018-04-26 | Discharge: 2018-04-27 | Disposition: A | Discharge status: Home / Self Care | Payer: BLUE CROSS/BLUE SHIELD | Source: Ambulatory Visit | Attending: Obstetrics and Gynecology | Admitting: Obstetrics and Gynecology

## 2018-04-26 DIAGNOSIS — O26893 Other specified pregnancy related conditions, third trimester: Secondary | ICD-10-CM | POA: Insufficient documentation

## 2018-04-26 DIAGNOSIS — O4703 False labor before 37 completed weeks of gestation, third trimester: Secondary | ICD-10-CM

## 2018-04-26 DIAGNOSIS — R109 Unspecified abdominal pain: Secondary | ICD-10-CM | POA: Insufficient documentation

## 2018-04-26 DIAGNOSIS — O479 False labor, unspecified: Secondary | ICD-10-CM

## 2018-04-26 DIAGNOSIS — O4700 False labor before 37 completed weeks of gestation, unspecified trimester: Secondary | ICD-10-CM

## 2018-04-26 DIAGNOSIS — Z3A29 29 weeks gestation of pregnancy: Secondary | ICD-10-CM | POA: Insufficient documentation

## 2018-04-27 ENCOUNTER — Encounter (HOSPITAL_COMMUNITY): Payer: Self-pay

## 2018-04-27 DIAGNOSIS — O26893 Other specified pregnancy related conditions, third trimester: Secondary | ICD-10-CM | POA: Diagnosis not present

## 2018-04-27 DIAGNOSIS — Z3A29 29 weeks gestation of pregnancy: Secondary | ICD-10-CM | POA: Diagnosis not present

## 2018-04-27 DIAGNOSIS — O4703 False labor before 37 completed weeks of gestation, third trimester: Secondary | ICD-10-CM

## 2018-04-27 DIAGNOSIS — R109 Unspecified abdominal pain: Secondary | ICD-10-CM | POA: Diagnosis not present

## 2018-04-27 DIAGNOSIS — O4700 False labor before 37 completed weeks of gestation, unspecified trimester: Secondary | ICD-10-CM

## 2018-04-27 DIAGNOSIS — O479 False labor, unspecified: Secondary | ICD-10-CM

## 2018-04-27 LAB — URINALYSIS, ROUTINE W REFLEX MICROSCOPIC
Bilirubin Urine: NEGATIVE
Glucose, UA: NEGATIVE mg/dL
Hgb urine dipstick: NEGATIVE
Ketones, ur: NEGATIVE mg/dL
Leukocytes,Ua: NEGATIVE
Nitrite: NEGATIVE
Protein, ur: NEGATIVE mg/dL
Specific Gravity, Urine: 1.003 — ABNORMAL LOW (ref 1.005–1.030)
pH: 7 (ref 5.0–8.0)

## 2018-04-27 LAB — FETAL FIBRONECTIN: Fetal Fibronectin: NEGATIVE

## 2018-04-27 MED ORDER — NIFEDIPINE 10 MG PO CAPS
10.0000 mg | ORAL_CAPSULE | ORAL | Status: DC | PRN
  Administered 2018-04-27 (×2): 10 mg via ORAL
  Filled 2018-04-27 (×2): qty 1

## 2018-04-27 NOTE — MAU Note (Signed)
States she has been feeling contractions throughout the day-calmed down this afternoon but started getting stronger a few minutes ago.  No LOF/VB.  + FM.  No complications w/ pregnancy

## 2018-04-27 NOTE — Discharge Instructions (Signed)
Preterm Labor and Birth Information °Pregnancy normally lasts 39-41 weeks. Preterm labor is when labor starts early. It starts before you have been pregnant for 37 whole weeks. °What are the risk factors for preterm labor? °Preterm labor is more likely to occur in women who: °· Have an infection while pregnant. °· Have a cervix that is short. °· Have gone into preterm labor before. °· Have had surgery on their cervix. °· Are younger than age 30. °· Are older than age 35. °· Are African American. °· Are pregnant with two or more babies. °· Take street drugs while pregnant. °· Smoke while pregnant. °· Do not gain enough weight while pregnant. °· Got pregnant right after another pregnancy. °What are the symptoms of preterm labor? °Symptoms of preterm labor include: °· Cramps. The cramps may feel like the cramps some women get during their period. The cramps may happen with watery poop (diarrhea). °· Pain in the belly (abdomen). °· Pain in the lower back. °· Regular contractions or tightening. It may feel like your belly is getting tighter. °· Pressure in the lower belly that seems to get stronger. °· More fluid (discharge) leaking from the vagina. The fluid may be watery or bloody. °· Water breaking. °Why is it important to notice signs of preterm labor? °Babies who are born early may not be fully developed. They have a higher chance for: °· Long-term heart problems. °· Long-term lung problems. °· Trouble controlling body systems, like breathing. °· Bleeding in the brain. °· A condition called cerebral palsy. °· Learning difficulties. °· Death. °These risks are highest for babies who are born before 34 weeks of pregnancy. °How is preterm labor treated? °Treatment depends on: °· How long you were pregnant. °· Your condition. °· The health of your baby. °Treatment may involve: °· Having a stitch (suture) placed in your cervix. When you give birth, your cervix opens so the baby can come out. The stitch keeps the cervix  from opening too soon. °· Staying at the hospital. °· Taking or getting medicines, such as: °? Hormone medicines. °? Medicines to stop contractions. °? Medicines to help the baby’s lungs develop. °? Medicines to prevent your baby from having cerebral palsy. °What should I do if I am in preterm labor? °If you think you are going into labor too soon, call your doctor right away. °How can I prevent preterm labor? °· Do not use any tobacco products. °? Examples of these are cigarettes, chewing tobacco, and e-cigarettes. °? If you need help quitting, ask your doctor. °· Do not use street drugs. °· Do not use any medicines unless you ask your doctor if they are safe for you. °· Talk with your doctor before taking any herbal supplements. °· Make sure you gain enough weight. °· Watch for infection. If you think you might have an infection, get it checked right away. °· If you have gone into preterm labor before, tell your doctor. °This information is not intended to replace advice given to you by your health care provider. Make sure you discuss any questions you have with your health care provider. °Document Released: 04/08/2008 Document Revised: 06/23/2015 Document Reviewed: 06/03/2015 °Elsevier Interactive Patient Education © 2019 Elsevier Inc. ° °

## 2018-04-27 NOTE — MAU Provider Note (Signed)
Chief Complaint:  Abdominal Pain   First Provider Initiated Contact with Patient 04/27/18 0159      HPI: Caitlin Rhodes is a 28 y.o. [id number] at 57w3dwho presents to maternity admissions reporting contractions which come and go but got stronger tonight. . She reports good fetal movement, denies LOF, vaginal bleeding, vaginal itching/burning, urinary symptoms, h/a, dizziness, n/v, diarrhea, constipation or fever/chills.   RN Note: States she has been feeling contractions throughout the day-calmed down this afternoon but started getting stronger a few minutes ago.  No LOF/VB.  + FM.  No complications w/ pregnancy  Past Medical History: Past Medical History:  Diagnosis Date  . Seizures (HCC)     Past obstetric history: OB History  Gravida Para Term Preterm AB Living  5 3 3  0 1 3  SAB TAB Ectopic Multiple Live Births  1 0 0 0 3    # Outcome Date GA Lbr Len/2nd Weight Sex Delivery Anes PTL Lv  5 Current           4 SAB           3 Term      Vag-Spont     2 Term      Vag-Spont     1 Term      Vag-Spont       Past Surgical History: Past Surgical History:  Procedure Laterality Date  . NO PAST SURGERIES      Family History: Family History  Problem Relation Age of Onset  . Cancer Maternal Grandmother   . Cancer Paternal Grandmother     Social History: Social History   Tobacco Use  . Smoking status: Never Smoker  . Smokeless tobacco: Never Used  Substance Use Topics  . Alcohol use: Not Currently  . Drug use: No    Allergies: No Known Allergies  Meds:  No medications prior to admission.    I have reviewed patient's Past Medical Hx, Surgical Hx, Family Hx, Social Hx, medications and allergies.   ROS:  Review of Systems  Constitutional: Negative for chills and fever.  Respiratory: Negative for shortness of breath.   Gastrointestinal: Positive for abdominal pain. Negative for constipation, diarrhea and nausea.  Genitourinary: Positive for pelvic pain.  Negative for dysuria and vaginal bleeding.   Other systems negative  Physical Exam   Patient Vitals for the past 24 hrs:  BP Temp Pulse Resp SpO2  04/27/18 0402 106/71 - - - -  04/27/18 0315 108/78 - - - -  04/27/18 0247 104/63 - - - -  04/27/18 0041 101/72 98.2 F (36.8 C) 76 17 100 %   Constitutional: Well-developed, well-nourished female in no acute distress.  Cardiovascular: normal rate and rhythm Respiratory: normal effort, clear to auscultation bilaterally GI: Abd soft, non-tender, gravid appropriate for gestational age.   No rebound or guarding. MS: Extremities nontender, no edema, normal ROM Neurologic: Alert and oriented x 4.  GU: Neg CVAT.  PELVIC EXAM:  Dilation: Closed Effacement (%): Thick Cervical Position: Posterior Station: -1 Exam by:: Laurieann Friddle, CNM  FHT:  Baseline 135 , moderate variability, accelerations present, no decelerations Contractions: q 3-4 mins Irregular     Labs: Results for orders placed or performed during the hospital encounter of 04/26/18 (from the past 24 hour(s))  Urinalysis, Routine w reflex microscopic     Status: Abnormal   Collection Time: 04/27/18  1:40 AM  Result Value Ref Range   Color, Urine STRAW (A) YELLOW   APPearance CLEAR CLEAR  Specific Gravity, Urine 1.003 (L) 1.005 - 1.030   pH 7.0 5.0 - 8.0   Glucose, UA NEGATIVE NEGATIVE mg/dL   Hgb urine dipstick NEGATIVE NEGATIVE   Bilirubin Urine NEGATIVE NEGATIVE   Ketones, ur NEGATIVE NEGATIVE mg/dL   Protein, ur NEGATIVE NEGATIVE mg/dL   Nitrite NEGATIVE NEGATIVE   Leukocytes,Ua NEGATIVE NEGATIVE  Fetal fibronectin     Status: None   Collection Time: 04/27/18  2:04 AM  Result Value Ref Range   Fetal Fibronectin NEGATIVE NEGATIVE   O/Positive/-- (11/26 0000)  Imaging:    MAU Course/MDM: I have ordered labs and reviewed results. Fetal fibronectin was negative.Urine is clear and dilute.   NST reviewed, reactive and category I  Treatments in MAU included Procardia x 2  doses.  Contractions diminished and we received result of negative fetal fibronectin.   Discharged home.    Assessment: 1. Preterm uterine contractions in third trimester, antepartum   2.     Negative fetal fibronectin  Plan: Discharge home Preterm Labor precautions and fetal kick counts Follow up in Office for prenatal visits and recheck of status  Follow-up Information    [facility] Follow up.   Specialty:  Obstetrics and Gynecology Contact information: [address], [address] [place] [address] [phone]        Encouraged to return here or to other Urgent Care/ED if she develops worsening of symptoms, increase in pain, fever, or other concerning symptoms.   Pt stable at time of discharge.  Wynelle Bourgeois CNM, MSN Certified Nurse-Midwife 04/27/2018 9:29 AM

## 2018-05-03 ENCOUNTER — Other Ambulatory Visit: Payer: Self-pay

## 2018-05-03 ENCOUNTER — Encounter: Payer: Self-pay | Admitting: Certified Nurse Midwife

## 2018-05-03 ENCOUNTER — Ambulatory Visit (INDEPENDENT_AMBULATORY_CARE_PROVIDER_SITE_OTHER): Payer: BLUE CROSS/BLUE SHIELD | Admitting: Certified Nurse Midwife

## 2018-05-03 DIAGNOSIS — G40909 Epilepsy, unspecified, not intractable, without status epilepticus: Secondary | ICD-10-CM

## 2018-05-03 DIAGNOSIS — Z3483 Encounter for supervision of other normal pregnancy, third trimester: Secondary | ICD-10-CM

## 2018-05-03 DIAGNOSIS — Z3480 Encounter for supervision of other normal pregnancy, unspecified trimester: Secondary | ICD-10-CM

## 2018-05-03 DIAGNOSIS — Z3A30 30 weeks gestation of pregnancy: Secondary | ICD-10-CM

## 2018-05-03 MED ORDER — BLOOD PRESSURE MONITOR KIT: 1.0000 | PACK | Freq: Every day | 0 refills | Status: DC

## 2018-05-03 NOTE — Progress Notes (Signed)
c/o: braxton hicks contractions.  MAU on 04/27/18 B/P cuff placed upfront.

## 2018-05-03 NOTE — Progress Notes (Signed)
   TELEHEALTH VIRTUAL OBSTETRICS VISIT ENCOUNTER NOTE  I connected with Caitlin Rhodes on 05/03/18 at 11:06 AM EDT by telephone at home and verified that I am speaking with the correct person using two identifiers.   I discussed the limitations, risks, security and privacy concerns of performing an evaluation and management service by telephone and the availability of in person appointments. I also discussed with the patient that there may be a patient responsible charge related to this service. The patient expressed understanding and agreed to proceed.  Subjective:  Caitlin Rhodes is a 28 y.o. [id number] at [redacted]w[redacted]d being followed for ongoing prenatal care.  She is currently monitored for the following issues for this low-risk pregnancy and has Seizure disorder (HCC); Supervision of normal pregnancy, antepartum; No leakage of amniotic fluid into vagina; Abnormal fetal ultrasound; and Preterm uterine contractions on their problem list.  Patient reports no complaints. Reports fetal movement. Denies any contractions, bleeding or leaking of fluid.   The following portions of the patient's history were reviewed and updated as appropriate: allergies, current medications, past family history, past medical history, past social history, past surgical history and problem list.   Objective:   General:  Alert, oriented and cooperative.   Mental Status: Normal mood and affect perceived. Normal judgment and thought content.  Rest of physical exam deferred due to type of encounter  Assessment and Plan:  Pregnancy: G5P3013 at [redacted]w[redacted]d 1. Supervision of other normal pregnancy, antepartum - Patient doing well, no complaints - Anticipatory guidance on upcoming appointments including telehealth appointment  - Plan for in person appointment at 36 weeks for GBS swabs  - Enrolled into babyscipts app, BP cuff Rx sent to pharmacy of choice  - Discussed importance of obtaining BP and weight weekly and  entering into babyscripts, patient verbalizes understanding  - Babyscripts Schedule Optimization  2. Seizure disorder (HCC) - Hx of seizures, last one in 2011, currently on no medication   Preterm labor symptoms and general obstetric precautions including but not limited to vaginal bleeding, contractions, leaking of fluid and fetal movement were reviewed in detail with the patient.  I discussed the assessment and treatment plan with the patient. The patient was provided an opportunity to ask questions and all were answered. The patient agreed with the plan and demonstrated an understanding of the instructions. The patient was advised to call back or seek an in-person office evaluation/go to MAU at [facility] for any urgent or concerning symptoms. Please refer to After Visit Summary for other counseling recommendations.   I provided 6 minutes of non-face-to-face time during this encounter.  Return in about 2 weeks (around 05/17/2018) for ROB-telehealth.  Future Appointments  Date Time Provider Department Center  05/16/2018 11:15 AM Sharyon Cable, CNM [facility] None    Sharyon Cable, CNM [facility] [employer], [facility]

## 2018-05-09 ENCOUNTER — Telehealth: Payer: Self-pay

## 2018-05-09 DIAGNOSIS — Z3480 Encounter for supervision of other normal pregnancy, unspecified trimester: Secondary | ICD-10-CM

## 2018-05-09 NOTE — Telephone Encounter (Signed)
-----   Message from Sharyon Cable, CNM sent at 05/09/2018 10:14 AM EDT ----- Regarding: RE: BCBS BP Cuff That is fine can you give her one from the office  ----- Message ----- From: Maretta Bees, RMA Sent: 05/08/2018   4:45 PM EDT To: Sharyon Cable, CNM Subject: BCBS BP Cuff                                   Hi Suzette Battiest,  Patient said her BCBS does not pay for BP cuff. She wants one from the Office.

## 2018-05-09 NOTE — Telephone Encounter (Signed)
Patient notified to pick up BP cuff from office. Take Bp and manually enter readings each week.

## 2018-05-14 DIAGNOSIS — N898 Other specified noninflammatory disorders of vagina: Secondary | ICD-10-CM

## 2018-05-16 ENCOUNTER — Encounter: Payer: Self-pay | Admitting: Certified Nurse Midwife

## 2018-05-16 ENCOUNTER — Ambulatory Visit (INDEPENDENT_AMBULATORY_CARE_PROVIDER_SITE_OTHER): Payer: BLUE CROSS/BLUE SHIELD | Admitting: Certified Nurse Midwife

## 2018-05-16 ENCOUNTER — Other Ambulatory Visit: Payer: Self-pay

## 2018-05-16 VITALS — BP 107/78 | HR 82

## 2018-05-16 DIAGNOSIS — G40909 Epilepsy, unspecified, not intractable, without status epilepticus: Secondary | ICD-10-CM

## 2018-05-16 DIAGNOSIS — Z3480 Encounter for supervision of other normal pregnancy, unspecified trimester: Secondary | ICD-10-CM

## 2018-05-16 DIAGNOSIS — Z3483 Encounter for supervision of other normal pregnancy, third trimester: Secondary | ICD-10-CM

## 2018-05-16 NOTE — Progress Notes (Signed)
   TELEHEALTH VIRTUAL OBSTETRICS VISIT ENCOUNTER NOTE  I connected with Caitlin Rhodes on 05/16/18 at 10:57 AM EDT by telephone at home and verified that I am speaking with the correct person using two identifiers.   I discussed the limitations, risks, security and privacy concerns of performing an evaluation and management service by telephone and the availability of in person appointments. I also discussed with the patient that there may be a patient responsible charge related to this service. The patient expressed understanding and agreed to proceed.  Subjective:  Caitlin Rhodes is a 28 y.o. [id number] at [redacted]w[redacted]d being followed for ongoing prenatal care.  She is currently monitored for the following issues for this low-risk pregnancy and has Seizure disorder (HCC); Supervision of normal pregnancy, antepartum; No leakage of amniotic fluid into vagina; Abnormal fetal ultrasound; and Preterm uterine contractions on their problem list.  Patient reports no complaints. Reports fetal movement. Denies any contractions, bleeding or leaking of fluid.   The following portions of the patient's history were reviewed and updated as appropriate: allergies, current medications, past family history, past medical history, past social history, past surgical history and problem list.   Objective:   General:  Alert, oriented and cooperative.   Mental Status: Normal mood and affect perceived. Normal judgment and thought content.  Rest of physical exam deferred due to type of encounter  Assessment and Plan:  Pregnancy: G5P3013 at [redacted]w[redacted]d 1. Supervision of other normal pregnancy, antepartum - patient doing well, no complaints - BP 107/78 - Anticipatory guidance on upcoming appointments with GBS at next visit  - Reviewed with patient importance of entry into babyscripts weekly with BP and weight, patient reports she will start entry today  - COVID19 precautions   2. Seizure disorder (HCC) - last  seizure 2011, currently on no medication   Preterm labor symptoms and general obstetric precautions including but not limited to vaginal bleeding, contractions, leaking of fluid and fetal movement were reviewed in detail with the patient.  I discussed the assessment and treatment plan with the patient. The patient was provided an opportunity to ask questions and all were answered. The patient agreed with the plan and demonstrated an understanding of the instructions. The patient was advised to call back or seek an in-person office evaluation/go to MAU at [facility] for any urgent or concerning symptoms. Please refer to After Visit Summary for other counseling recommendations.   I provided 5 minutes of non-face-to-face time during this encounter.  Return in about 4 weeks (around 06/13/2018) for ROB/GBS.  Sharyon Cable, CNM [facility] [employer], [facility]

## 2018-05-16 NOTE — Progress Notes (Signed)
Pt is on the phone preparing for Webex visit with provider. [redacted]w[redacted]d. Pt has BP cuff at home and is able to check BP while on the phone.

## 2018-05-17 MED ORDER — METRONIDAZOLE 500 MG PO TABS: 500.0000 mg | ORAL_TABLET | Freq: Two times a day (BID) | ORAL | 0 refills | Status: DC

## 2018-06-02 LAB — OB RESULTS CONSOLE GBS: GBS: NEGATIVE

## 2018-06-10 ENCOUNTER — Inpatient Hospital Stay (HOSPITAL_COMMUNITY)
Admission: AD | Admit: 2018-06-10 | Discharge: 2018-06-10 | Disposition: A | Discharge status: Home / Self Care | Payer: BLUE CROSS/BLUE SHIELD | Attending: Obstetrics and Gynecology | Admitting: Obstetrics and Gynecology

## 2018-06-10 ENCOUNTER — Other Ambulatory Visit: Payer: Self-pay

## 2018-06-10 ENCOUNTER — Encounter (HOSPITAL_COMMUNITY): Payer: Self-pay

## 2018-06-10 DIAGNOSIS — O479 False labor, unspecified: Secondary | ICD-10-CM

## 2018-06-10 DIAGNOSIS — O4700 False labor before 37 completed weeks of gestation, unspecified trimester: Secondary | ICD-10-CM

## 2018-06-10 DIAGNOSIS — O4703 False labor before 37 completed weeks of gestation, third trimester: Secondary | ICD-10-CM | POA: Diagnosis not present

## 2018-06-10 DIAGNOSIS — Z3A35 35 weeks gestation of pregnancy: Secondary | ICD-10-CM

## 2018-06-10 DIAGNOSIS — O99353 Diseases of the nervous system complicating pregnancy, third trimester: Secondary | ICD-10-CM | POA: Insufficient documentation

## 2018-06-10 DIAGNOSIS — G40909 Epilepsy, unspecified, not intractable, without status epilepticus: Secondary | ICD-10-CM | POA: Insufficient documentation

## 2018-06-10 LAB — URINALYSIS, ROUTINE W REFLEX MICROSCOPIC
Bilirubin Urine: NEGATIVE
Glucose, UA: NEGATIVE mg/dL
Hgb urine dipstick: NEGATIVE
Ketones, ur: NEGATIVE mg/dL
Leukocytes,Ua: NEGATIVE
Nitrite: NEGATIVE
Protein, ur: NEGATIVE mg/dL
Specific Gravity, Urine: 1.005 (ref 1.005–1.030)
pH: 6 (ref 5.0–8.0)

## 2018-06-10 MED ORDER — NIFEDIPINE 10 MG PO CAPS
10.0000 mg | ORAL_CAPSULE | Freq: Once | ORAL | Status: AC
  Administered 2018-06-10: 02:00:00 10 mg via ORAL

## 2018-06-10 NOTE — MAU Provider Note (Signed)
History     CSN: [id number]  Arrival date and time: 06/10/18 0041   First Provider Initiated Contact with Patient 06/10/18 0124      Chief Complaint  Patient presents with  . Contractions   HPI   Ms.Caitlin Rhodes is a 27 y.o. female G5P3013 @[redacted]w[redacted]d with no history of preterm deliveries, here in MAU with contractions that started around 9 pm last night. No bleeding. She says she feels all of the contractions, however not all of them are painful. When she has a contraction she rates it 7/10. The contractions radiate around to her lower back. No recent intercourse. + fetal movement. She does feel the contractions are irregular in pattern.   OB History    Gravida  5   Para  3   Term  3   Preterm  0   AB  1   Living  3     SAB  1   TAB  0   Ectopic  0   Multiple  0   Live Births  3           Past Medical History:  Diagnosis Date  . Seizures (HCC)     Past Surgical History:  Procedure Laterality Date  . NO PAST SURGERIES      Family History  Problem Relation Age of Onset  . Cancer Maternal Grandmother   . Cancer Paternal Grandmother     Social History   Tobacco Use  . Smoking status: Never Smoker  . Smokeless tobacco: Never Used  Substance Use Topics  . Alcohol use: Not Currently  . Drug use: No    Allergies: No Known Allergies  Medications Prior to Admission  Medication Sig Dispense Refill Last Dose  . Blood Pressure Monitor KIT 1 Device by Does not apply route daily. 1 each 0 Past Week at Unknown time  . Prenatal MV-Min-FA-Omega-3 (PRENATAL GUMMIES/DHA & FA) 0.4-32.5 MG CHEW Chew by mouth.   06/09/2018 at Unknown time  . Probiotic Product (PROBIOTIC PO) Take by mouth.   06/09/2018 at Unknown time   Results for orders placed or performed during the hospital encounter of 06/10/18 (from the past 48 hour(s))  Urinalysis, Routine w reflex microscopic     Status: Abnormal   Collection Time: 06/10/18 12:49 AM  Result Value Ref Range    Color, Urine STRAW (A) YELLOW   APPearance CLEAR CLEAR   Specific Gravity, Urine 1.005 1.005 - 1.030   pH 6.0 5.0 - 8.0   Glucose, UA NEGATIVE NEGATIVE mg/dL   Hgb urine dipstick NEGATIVE NEGATIVE   Bilirubin Urine NEGATIVE NEGATIVE   Ketones, ur NEGATIVE NEGATIVE mg/dL   Protein, ur NEGATIVE NEGATIVE mg/dL   Nitrite NEGATIVE NEGATIVE   Leukocytes,Ua NEGATIVE NEGATIVE    Comment: Performed at [facility], [address] , [place] [address]    Review of Systems  Constitutional: Negative for fever.  Gastrointestinal: Negative for abdominal pain.  Genitourinary: Negative for pelvic pain and vaginal bleeding.   Physical Exam   Blood pressure 107/70, pulse 90, temperature 98.5 F (36.9 C), temperature source Oral, resp. rate 16, height 5' 7" (1.702 m), weight 70.5 kg, last menstrual period 09/27/2017, SpO2 99 %.  Physical Exam  Constitutional: She is oriented to person, place, and time. She appears well-developed and well-nourished. No distress.  HENT:  Head: Normocephalic.  Eyes: Pupils are equal, round, and reactive to light.  GI: Soft. Bowel sounds are normal.  Genitourinary:    Genitourinary   Comments: Cervix: 1 cm, thick, posterior    Neurological: She is alert and oriented to person, place, and time.  Skin: Skin is warm. She is not diaphoretic.  Psychiatric: Her behavior is normal.   Fetal Tracing: Baseline: 130 bpm Variability: Moderate  Accelerations: 15x15 Decelerations: None Toco: Irregular pattern   MAU Course  Procedures  None  MDM  Procardia 10 mg PO Oral fluids pushed in MAU. Cervix unchanged, patient feels better and feels ok to go home.    Assessment and Plan   A:  1. Preterm uterine contractions   2. [redacted] weeks gestation of pregnancy   3. Braxton Hicks contractions   4. Seizure disorder (HCC)     P:  Discharge home in stable condition Return to MAU if symptoms worsen Preterm labor precautions Follow up with OB as  scheduled  ,  I, NP 06/10/2018 2:45 AM  

## 2018-06-10 NOTE — Discharge Instructions (Signed)
LEG CRAMPS -- Leg cramps are common, usually occurring during the latter half of pregnancy. The cramps are due to painful muscle contractions and are generally experienced in the calves at night. They are thought to be secondary to a buildup of lactic and pyruvic acids leading to involuntary contraction of the affected muscles, but the exact etiology is unknown.  A Cochrane review found that the only placebo-controlled trial of calcium treatment showed no evidence of benefit in the treatment of leg cramps, but placebo controlled trials of magnesium supplementation suggested a possible benefit (odds ratio 0.18, 95 percent CI 0.05 to 0.60). This was a small trial of 1869 pregnant women with persistent leg cramps. The preparation used was magnesium lactate or citrate 5 mmol in the morning and 10 mmol in the evening.    Stretching exercises may be an effective preventive measure. These can be performed in the weight-bearing position; they are held for 20 seconds and repeated three times in succession, four times daily for one week, then twice daily thereafter.  If a cramp occurs, calf stretches (toe raises), walking, or leg jiggling followed by leg elevation may be helpful. Other nonpharmacologic remedies include: ?A hot shower or warm tub bath ?Ice massage ?Regular exercise for conditioning, calf strengthening and stretching ?Increased hydration ?Use of long-countered shoes and other proper foot gear.     Braxton Hicks Contractions Contractions of the uterus can occur throughout pregnancy, but they are not always a sign that you are in labor. You may have practice contractions called Braxton Hicks contractions. These false labor contractions are sometimes confused with true labor. What are Deberah PeltonBraxton Hicks contractions? Braxton Hicks contractions are tightening movements that occur in the muscles of the uterus before labor. Unlike true labor contractions, these contractions do not result in opening  (dilation) and thinning of the cervix. Toward the end of pregnancy (32-34 weeks), Braxton Hicks contractions can happen more often and may become stronger. These contractions are sometimes difficult to tell apart from true labor because they can be very uncomfortable. You should not feel embarrassed if you go to the hospital with false labor. Sometimes, the only way to tell if you are in true labor is for your health care provider to look for changes in the cervix. The health care provider will do a physical exam and may monitor your contractions. If you are not in true labor, the exam should show that your cervix is not dilating and your water has not broken. If there are no other health problems associated with your pregnancy, it is completely safe for you to be sent home with false labor. You may continue to have Braxton Hicks contractions until you go into true labor. How to tell the difference between true labor and false labor True labor  Contractions last 30-70 seconds.  Contractions become very regular.  Discomfort is usually felt in the top of the uterus, and it spreads to the lower abdomen and low back.  Contractions do not go away with walking.  Contractions usually become more intense and increase in frequency.  The cervix dilates and gets thinner. False labor  Contractions are usually shorter and not as strong as true labor contractions.  Contractions are usually irregular.  Contractions are often felt in the front of the lower abdomen and in the groin.  Contractions may go away when you walk around or change positions while lying down.  Contractions get weaker and are shorter-lasting as time goes on.  The cervix usually does not  dilate or become thin. Follow these instructions at home:   Take over-the-counter and prescription medicines only as told by your health care provider.  Keep up with your usual exercises and follow other instructions from your health care  provider.  Eat and drink lightly if you think you are going into labor.  If Braxton Hicks contractions are making you uncomfortable: ? Change your position from lying down or resting to walking, or change from walking to resting. ? Sit and rest in a tub of warm water. ? Drink enough fluid to keep your urine pale yellow. Dehydration may cause these contractions. ? Do slow and deep breathing several times an hour.  Keep all follow-up prenatal visits as told by your health care provider. This is important. Contact a health care provider if:  You have a fever.  You have continuous pain in your abdomen. Get help right away if:  Your contractions become stronger, more regular, and closer together.  You have fluid leaking or gushing from your vagina.  You pass blood-tinged mucus (bloody show).  You have bleeding from your vagina.  You have low back pain that you never had before.  You feel your babys head pushing down and causing pelvic pressure.  Your baby is not moving inside you as much as it used to. Summary  Contractions that occur before labor are called Braxton Hicks contractions, false labor, or practice contractions.  Braxton Hicks contractions are usually shorter, weaker, farther apart, and less regular than true labor contractions. True labor contractions usually become progressively stronger and regular, and they become more frequent.  Manage discomfort from [facility] contractions by changing position, resting in a warm bath, drinking plenty of water, or practicing deep breathing. This information is not intended to replace advice given to you by your health care provider. Make sure you discuss any questions you have with your health care provider. Document Released: 05/26/2016 Document Revised: 10/25/2016 Document Reviewed: 05/26/2016 Elsevier Interactive Patient Education  2019 [employer].

## 2018-06-10 NOTE — MAU Note (Signed)
Patient presents to MAU c/o contractions. Started 2 hours ago. Patient reports 5-10 minutes apart.  +FM. No vaginal bleeding or LOF.

## 2018-06-13 ENCOUNTER — Other Ambulatory Visit: Payer: Self-pay

## 2018-06-13 ENCOUNTER — Other Ambulatory Visit (HOSPITAL_COMMUNITY)
Admission: RE | Admit: 2018-06-13 | Discharge: 2018-06-13 | Disposition: A | Discharge status: Home / Self Care | Payer: BLUE CROSS/BLUE SHIELD | Source: Ambulatory Visit | Attending: Obstetrics and Gynecology | Admitting: Obstetrics and Gynecology

## 2018-06-13 ENCOUNTER — Encounter: Payer: Self-pay | Admitting: Obstetrics and Gynecology

## 2018-06-13 ENCOUNTER — Ambulatory Visit (INDEPENDENT_AMBULATORY_CARE_PROVIDER_SITE_OTHER): Payer: BLUE CROSS/BLUE SHIELD | Admitting: Obstetrics and Gynecology

## 2018-06-13 VITALS — BP 113/79 | HR 81 | Temp 97.5°F | Wt 149.0 lb

## 2018-06-13 DIAGNOSIS — Z3480 Encounter for supervision of other normal pregnancy, unspecified trimester: Secondary | ICD-10-CM

## 2018-06-13 DIAGNOSIS — Z3A36 36 weeks gestation of pregnancy: Secondary | ICD-10-CM

## 2018-06-13 DIAGNOSIS — Z3009 Encounter for other general counseling and advice on contraception: Secondary | ICD-10-CM | POA: Insufficient documentation

## 2018-06-13 NOTE — Patient Instructions (Signed)
Third Trimester of Pregnancy The third trimester is from week 28 through week 40 (months 7 through 9). The third trimester is a time when the unborn baby (fetus) is growing rapidly. At the end of the ninth month, the fetus is about 20 inches in length and weighs 6-10 pounds. Body changes during your third trimester Your body will continue to go through many changes during pregnancy. The changes vary from woman to woman. During the third trimester:  Your weight will continue to increase. You can expect to gain 25-35 pounds (11-16 kg) by the end of the pregnancy.  You may begin to get stretch marks on your hips, abdomen, and breasts.  You may urinate more often because the fetus is moving lower into your pelvis and pressing on your bladder.  You may develop or continue to have heartburn. This is caused by increased hormones that slow down muscles in the digestive tract.  You may develop or continue to have constipation because increased hormones slow digestion and cause the muscles that push waste through your intestines to relax.  You may develop hemorrhoids. These are swollen veins (varicose veins) in the rectum that can itch or be painful.  You may develop swollen, bulging veins (varicose veins) in your legs.  You may have increased body aches in the pelvis, back, or thighs. This is due to weight gain and increased hormones that are relaxing your joints.  You may have changes in your hair. These can include thickening of your hair, rapid growth, and changes in texture. Some women also have hair loss during or after pregnancy, or hair that feels dry or thin. Your hair will most likely return to normal after your baby is born.  Your breasts will continue to grow and they will continue to become tender. A yellow fluid (colostrum) may leak from your breasts. This is the first milk you are producing for your baby.  Your belly button may stick out.  You may notice more swelling in your hands,  face, or ankles.  You may have increased tingling or numbness in your hands, arms, and legs. The skin on your belly may also feel numb.  You may feel short of breath because of your expanding uterus.  You may have more problems sleeping. This can be caused by the size of your belly, increased need to urinate, and an increase in your body's metabolism.  You may notice the fetus "dropping," or moving lower in your abdomen (lightening).  You may have increased vaginal discharge.  You may notice your joints feel loose and you may have pain around your pelvic bone. What to expect at prenatal visits You will have prenatal exams every 2 weeks until week 36. Then you will have weekly prenatal exams. During a routine prenatal visit:  You will be weighed to make sure you and the baby are growing normally.  Your blood pressure will be taken.  Your abdomen will be measured to track your baby's growth.  The fetal heartbeat will be listened to.  Any test results from the previous visit will be discussed.  You may have a cervical check near your due date to see if your cervix has softened or thinned (effaced).  You will be tested for Group B streptococcus. This happens between 35 and 37 weeks. Your health care provider may ask you:  What your birth plan is.  How you are feeling.  If you are feeling the baby move.  If you have had any abnormal   symptoms, such as leaking fluid, bleeding, severe headaches, or abdominal cramping.  If you are using any tobacco products, including cigarettes, chewing tobacco, and electronic cigarettes.  If you have any questions. Other tests or screenings that may be performed during your third trimester include:  Blood tests that check for low iron levels (anemia).  Fetal testing to check the health, activity level, and growth of the fetus. Testing is done if you have certain medical conditions or if there are problems during the pregnancy.  Nonstress test  (NST). This test checks the health of your baby to make sure there are no signs of problems, such as the baby not getting enough oxygen. During this test, a belt is placed around your belly. The baby is made to move, and its heart rate is monitored during movement. What is false labor? False labor is a condition in which you feel small, irregular tightenings of the muscles in the womb (contractions) that usually go away with rest, changing position, or drinking water. These are called Braxton Hicks contractions. Contractions may last for hours, days, or even weeks before true labor sets in. If contractions come at regular intervals, become more frequent, increase in intensity, or become painful, you should see your health care provider. What are the signs of labor?  Abdominal cramps.  Regular contractions that start at 10 minutes apart and become stronger and more frequent with time.  Contractions that start on the top of the uterus and spread down to the lower abdomen and back.  Increased pelvic pressure and dull back pain.  A watery or bloody mucus discharge that comes from the vagina.  Leaking of amniotic fluid. This is also known as your "water breaking." It could be a slow trickle or a gush. Let your health care provider know if it has a color or strange odor. If you have any of these signs, call your health care provider right away, even if it is before your due date. Follow these instructions at home: Medicines  Follow your health care provider's instructions regarding medicine use. Specific medicines may be either safe or unsafe to take during pregnancy.  Take a prenatal vitamin that contains at least 600 micrograms (mcg) of folic acid.  If you develop constipation, try taking a stool softener if your health care provider approves. Eating and drinking   Eat a balanced diet that includes fresh fruits and vegetables, whole grains, good sources of protein such as meat, eggs, or tofu,  and low-fat dairy. Your health care provider will help you determine the amount of weight gain that is right for you.  Avoid raw meat and uncooked cheese. These carry germs that can cause birth defects in the baby.  If you have low calcium intake from food, talk to your health care provider about whether you should take a daily calcium supplement.  Eat four or five small meals rather than three large meals a day.  Limit foods that are high in fat and processed sugars, such as fried and sweet foods.  To prevent constipation: ? Drink enough fluid to keep your urine clear or pale yellow. ? Eat foods that are high in fiber, such as fresh fruits and vegetables, whole grains, and beans. Activity  Exercise only as directed by your health care provider. Most women can continue their usual exercise routine during pregnancy. Try to exercise for 30 minutes at least 5 days a week. Stop exercising if you experience uterine contractions.  Avoid heavy lifting.  Do   not exercise in extreme heat or humidity, or at high altitudes.  Wear low-heel, comfortable shoes.  Practice good posture.  You may continue to have sex unless your health care provider tells you otherwise. Relieving pain and discomfort  Take frequent breaks and rest with your legs elevated if you have leg cramps or low back pain.  Take warm sitz baths to soothe any pain or discomfort caused by hemorrhoids. Use hemorrhoid cream if your health care provider approves.  Wear a good support bra to prevent discomfort from breast tenderness.  If you develop varicose veins: ? Wear support pantyhose or compression stockings as told by your healthcare provider. ? Elevate your feet for 15 minutes, 3-4 times a day. Prenatal care  Write down your questions. Take them to your prenatal visits.  Keep all your prenatal visits as told by your health care provider. This is important. Safety  Wear your seat belt at all times when driving.  Make  a list of emergency phone numbers, including numbers for family, friends, the hospital, and police and fire departments. General instructions  Avoid cat litter boxes and soil used by cats. These carry germs that can cause birth defects in the baby. If you have a cat, ask someone to clean the litter box for you.  Do not travel far distances unless it is absolutely necessary and only with the approval of your health care provider.  Do not use hot tubs, steam rooms, or saunas.  Do not drink alcohol.  Do not use any products that contain nicotine or tobacco, such as cigarettes and e-cigarettes. If you need help quitting, ask your health care provider.  Do not use any medicinal herbs or unprescribed drugs. These chemicals affect the formation and growth of the baby.  Do not douche or use tampons or scented sanitary pads.  Do not cross your legs for long periods of time.  To prepare for the arrival of your baby: ? Take prenatal classes to understand, practice, and ask questions about labor and delivery. ? Make a trial run to the hospital. ? Visit the hospital and tour the maternity area. ? Arrange for maternity or paternity leave through employers. ? Arrange for family and friends to take care of pets while you are in the hospital. ? Purchase a rear-facing car seat and make sure you know how to install it in your car. ? Pack your hospital bag. ? Prepare the baby's nursery. Make sure to remove all pillows and stuffed animals from the baby's crib to prevent suffocation.  Visit your dentist if you have not gone during your pregnancy. Use a soft toothbrush to brush your teeth and be gentle when you floss. Contact a health care provider if:  You are unsure if you are in labor or if your water has broken.  You become dizzy.  You have mild pelvic cramps, pelvic pressure, or nagging pain in your abdominal area.  You have lower back pain.  You have persistent nausea, vomiting, or  diarrhea.  You have an unusual or bad smelling vaginal discharge.  You have pain when you urinate. Get help right away if:  Your water breaks before 37 weeks.  You have regular contractions less than 5 minutes apart before 37 weeks.  You have a fever.  You are leaking fluid from your vagina.  You have spotting or bleeding from your vagina.  You have severe abdominal pain or cramping.  You have rapid weight loss or weight gain.  You have   shortness of breath with chest pain.  You notice sudden or extreme swelling of your face, hands, ankles, feet, or legs.  Your baby makes fewer than 10 movements in 2 hours.  You have severe headaches that do not go away when you take medicine.  You have vision changes. Summary  The third trimester is from week 28 through week 40, months 7 through 9. The third trimester is a time when the unborn baby (fetus) is growing rapidly.  During the third trimester, your discomfort may increase as you and your baby continue to gain weight. You may have abdominal, leg, and back pain, sleeping problems, and an increased need to urinate.  During the third trimester your breasts will keep growing and they will continue to become tender. A yellow fluid (colostrum) may leak from your breasts. This is the first milk you are producing for your baby.  False labor is a condition in which you feel small, irregular tightenings of the muscles in the womb (contractions) that eventually go away. These are called Braxton Hicks contractions. Contractions may last for hours, days, or even weeks before true labor sets in.  Signs of labor can include: abdominal cramps; regular contractions that start at 10 minutes apart and become stronger and more frequent with time; watery or bloody mucus discharge that comes from the vagina; increased pelvic pressure and dull back pain; and leaking of amniotic fluid. This information is not intended to replace advice given to you by your  health care provider. Make sure you discuss any questions you have with your health care provider. Document Released: 01/04/2001 Document Revised: 02/16/2016 Document Reviewed: 02/16/2016 Elsevier Interactive Patient Education  2019 [employer]  

## 2018-06-13 NOTE — Progress Notes (Signed)
Subjective:  Caitlin Rhodes is a 28 y.o. [id number] at [redacted]w[redacted]d being seen today for ongoing prenatal care.  She is currently monitored for the following issues for this low-risk pregnancy and has Seizure disorder (HCC); Supervision of normal pregnancy, antepartum; No leakage of amniotic fluid into vagina; Abnormal fetal ultrasound; Preterm uterine contractions; and Unwanted fertility on their problem list.  Patient reports general discomforts of pregnancy.  Contractions: Irritability. Vag. Bleeding: None.  Movement: Present. Denies leaking of fluid.   The following portions of the patient's history were reviewed and updated as appropriate: allergies, current medications, past family history, past medical history, past social history, past surgical history and problem list. Problem list updated.  Objective:   Vitals:   06/13/18 0909  BP: 113/79  Pulse: 81  Temp: (!) 97.5 F (36.4 C)  Weight: 149 lb (67.6 kg)    Fetal Status: Fetal Heart Rate (bpm): 135   Movement: Present     General:  Alert, oriented and cooperative. Patient is in no acute distress.  Skin: Skin is warm and dry. No rash noted.   Cardiovascular: Normal heart rate noted  Respiratory: Normal respiratory effort, no problems with respiration noted  Abdomen: Soft, gravid, appropriate for gestational age. Pain/Pressure: Absent     Pelvic:  Cervical exam performed        Extremities: Normal range of motion.  Edema: None  Mental Status: Normal mood and affect. Normal behavior. Normal judgment and thought content.   Urinalysis:      Assessment and Plan:  Pregnancy: [id number] at [redacted]w[redacted]d  1. Supervision of other normal pregnancy, antepartum Stable Labor precautions Continue to monitor BP - Cervicovaginal ancillary only( [place]) - Culture, beta strep (group b only)  2. Unwanted fertility BTL papers signed 04/12/18  Term labor symptoms and general obstetric precautions including but not limited to vaginal bleeding,  contractions, leaking of fluid and fetal movement were reviewed in detail with the patient. Please refer to After Visit Summary for other counseling recommendations.  Return in about 2 weeks (around 06/27/2018) for OB visit, televisit.   Hermina Staggers, MD

## 2018-06-13 NOTE — Progress Notes (Signed)
Pt presents for ROB, GBS, and GC/CT labs. No concerns per pt.

## 2018-06-14 ENCOUNTER — Encounter (HOSPITAL_COMMUNITY): Payer: Self-pay | Admitting: *Deleted

## 2018-06-14 ENCOUNTER — Other Ambulatory Visit: Payer: Self-pay

## 2018-06-14 ENCOUNTER — Observation Stay (HOSPITAL_COMMUNITY)
Admission: AD | Admit: 2018-06-14 | Discharge: 2018-06-16 | Disposition: A | Discharge status: Home / Self Care | Payer: BC Managed Care – PPO | Attending: Obstetrics and Gynecology | Admitting: Obstetrics and Gynecology

## 2018-06-14 DIAGNOSIS — N949 Unspecified condition associated with female genital organs and menstrual cycle: Secondary | ICD-10-CM

## 2018-06-14 DIAGNOSIS — Z3A36 36 weeks gestation of pregnancy: Secondary | ICD-10-CM | POA: Diagnosis not present

## 2018-06-14 DIAGNOSIS — O4703 False labor before 37 completed weeks of gestation, third trimester: Secondary | ICD-10-CM

## 2018-06-14 DIAGNOSIS — O9989 Other specified diseases and conditions complicating pregnancy, childbirth and the puerperium: Principal | ICD-10-CM | POA: Insufficient documentation

## 2018-06-14 DIAGNOSIS — R1031 Right lower quadrant pain: Secondary | ICD-10-CM | POA: Diagnosis not present

## 2018-06-14 DIAGNOSIS — W19XXXA Unspecified fall, initial encounter: Secondary | ICD-10-CM | POA: Diagnosis present

## 2018-06-14 DIAGNOSIS — W010XXA Fall on same level from slipping, tripping and stumbling without subsequent striking against object, initial encounter: Secondary | ICD-10-CM | POA: Diagnosis not present

## 2018-06-14 LAB — CERVICOVAGINAL ANCILLARY ONLY
Chlamydia: NEGATIVE
Neisseria Gonorrhea: NEGATIVE

## 2018-06-14 NOTE — MAU Note (Addendum)
PT fell about 2100. Was outside going down steps and wet from rain. Fell on bottom and R side. WEnt down about 6 steps. Denies LOF or bleeding. Has not noticed as much FM since fall. Lower part of abd has "stabbing" pain that feels like round ligament pain magnified

## 2018-06-14 NOTE — MAU Provider Note (Signed)
Chief Complaint:  Fall   First Provider Initiated Contact with Patient 06/14/18 2350      HPI: Caitlin Rhodes is a 28 y.o. [id number] at 57w2dho presents to maternity admissions reporting having a fall about 2100hrs on wet steps.  Landed on bottom and right side.  Has been having contractions and pain on abdomen.  . She reports decreased fetal movement, denies LOF, vaginal bleeding, vaginal itching/burning, urinary symptoms, h/a, dizziness, n/v, diarrhea, constipation or fever/chills.    RN Note: PT fell about 2100. Was outside going down steps and wet from rain. Fell on bottom and R side. WEnt down about 6 steps. Denies LOF or bleeding. Has not noticed as much FM since fall. Lower part of abd has "stabbing" pain that feels like round ligament pain magnified  Past Medical History: Past Medical History:  Diagnosis Date  . [place]     Past obstetric history: OB History  Gravida Para Term Preterm AB Living  _0 0 1 3  SAB TAB Ectopic Multiple Live Births  1 0 0 0 3    # Outcome Date GA Lbr Len/2nd Weight Sex Delivery Anes PTL Lv  5 Current           4 SAB           3 Term      Vag-Spont     2 Term      Vag-Spont     1 Term      Vag-Spont       Past Surgical History: Past Surgical History:  Procedure Laterality Date  . NO PAST SURGERIES      Family History: Family History  Problem Relation Age of Onset  . Cancer Maternal Grandmother   . Cancer Paternal Grandmother     Social History: Social History   Tobacco Use  . Smoking status: Never Smoker  . Smokeless tobacco: Never Used  Substance Use Topics  . Alcohol use: Not Currently  . Drug use: No    Allergies: No Known Allergies  Meds:  Medications Prior to Admission  Medication Sig Dispense Refill Last Dose  . Prenatal MV-Min-FA-Omega-3 (PRENATAL GUMMIES/DHA & FA) 0.4-32.5 MG CHEW Chew by mouth.   06/14/2018 at Unknown time  . Probiotic Product (PROBIOTIC PO) Take by mouth.   06/14/2018 at Unknown  time  . Blood Pressure Monitor KIT 1 Device by Does not apply route daily. 1 each 0 Taking    I have reviewed patient's Past Medical Hx, Surgical Hx, Family Hx, Social Hx, medications and allergies.   ROS:  Review of Systems  Constitutional: Negative for chills and fever.  Eyes: Negative for visual disturbance.  Respiratory: Negative for shortness of breath.   Gastrointestinal: Positive for abdominal pain. Negative for constipation, diarrhea and nausea.  Genitourinary: Positive for pelvic pain. Negative for dysuria, vaginal bleeding and vaginal discharge.  Musculoskeletal: Positive for back pain.   Other systems negative  Physical Exam   Patient Vitals for the past 24 hrs:  BP Temp Pulse Resp Height Weight  06/14/18 2246 108/76 98.5 F (36.9 C) (!) 104 18 5' 7" (1.702 m) 68 kg   Constitutional: Well-developed, well-nourished female in no acute distress.  Cardiovascular: normal rate and rhythm Respiratory: normal effort, clear to auscultation bilaterally GI: Abd soft, very tender over small area on abdomen just below and to right of umbilicus, abdomen is gravid appropriate for gestational age.   No rebound or guarding. MS: Extremities nontender, no edema, normal ROM Neurologic:  Alert and oriented x 4.  GU: Neg CVAT.  PELVIC EXAM: deferred   FHT:  Baseline 140 , moderate variability, accelerations present, no decelerations Contractions: q 4-6 mins Irregular     Labs: No results found for this or any previous visit (from the past 24 hour(s)).  O/Positive/-- (11/26 0000)  Imaging:  No results found.  MAU Course/MDM: I have ordered an [place] to evaluate anterior placenta for evidence possible abruption. NST reviewed and is reactive Consult Dr Rosana Hoes with presentation, exam findings and test results.  Treatments in MAU included EFM  Will admit for overnight observation due to contractions and uterine tenderness..    Assessment: Single intrauterine pregnancy at 18w3dS/P  Fall Uterine contractions Uterine tenderness  Plan: Admit to High Risk unit for overnight observation Ultrasound to evaluate placenta for possible abruption MD to follow  MHansel FeinsteinCNM, MSN Certified Nurse-Midwife 06/14/2018 11:50 PM

## 2018-06-14 NOTE — Progress Notes (Signed)
Baby active and pt feels FM 

## 2018-06-15 ENCOUNTER — Inpatient Hospital Stay (HOSPITAL_COMMUNITY): Payer: BC Managed Care – PPO

## 2018-06-15 DIAGNOSIS — Z3A36 36 weeks gestation of pregnancy: Secondary | ICD-10-CM

## 2018-06-15 DIAGNOSIS — O99353 Diseases of the nervous system complicating pregnancy, third trimester: Secondary | ICD-10-CM

## 2018-06-15 DIAGNOSIS — G40909 Epilepsy, unspecified, not intractable, without status epilepticus: Secondary | ICD-10-CM

## 2018-06-15 DIAGNOSIS — O26893 Other specified pregnancy related conditions, third trimester: Secondary | ICD-10-CM

## 2018-06-15 DIAGNOSIS — O283 Abnormal ultrasonic finding on antenatal screening of mother: Secondary | ICD-10-CM

## 2018-06-15 DIAGNOSIS — O358XX0 Maternal care for other (suspected) fetal abnormality and damage, not applicable or unspecified: Secondary | ICD-10-CM | POA: Diagnosis not present

## 2018-06-15 DIAGNOSIS — O4703 False labor before 37 completed weeks of gestation, third trimester: Secondary | ICD-10-CM

## 2018-06-15 DIAGNOSIS — W19XXXA Unspecified fall, initial encounter: Secondary | ICD-10-CM | POA: Diagnosis present

## 2018-06-15 DIAGNOSIS — O9989 Other specified diseases and conditions complicating pregnancy, childbirth and the puerperium: Secondary | ICD-10-CM | POA: Diagnosis not present

## 2018-06-15 LAB — ABO/RH: ABO/RH(D): O POS

## 2018-06-15 LAB — TYPE AND SCREEN
ABO/RH(D): O POS
Antibody Screen: NEGATIVE

## 2018-06-15 MED ORDER — CALCIUM CARBONATE ANTACID 500 MG PO CHEW: 2.0000 | CHEWABLE_TABLET | ORAL | Status: DC | PRN

## 2018-06-15 MED ORDER — PRENATAL MULTIVITAMIN CH
1.0000 | ORAL_TABLET | Freq: Every day | ORAL | Status: DC
  Administered 2018-06-15: 12:00:00 1 via ORAL
  Filled 2018-06-15: qty 1

## 2018-06-15 MED ORDER — ACETAMINOPHEN 325 MG PO TABS
650.0000 mg | ORAL_TABLET | ORAL | Status: DC | PRN
  Administered 2018-06-15 (×3): 650 mg via ORAL
  Filled 2018-06-15 (×3): qty 2

## 2018-06-15 MED ORDER — ZOLPIDEM TARTRATE 5 MG PO TABS: 5.0000 mg | ORAL_TABLET | Freq: Every evening | ORAL | Status: DC | PRN

## 2018-06-15 MED ORDER — DOCUSATE SODIUM 100 MG PO CAPS: 100.0000 mg | ORAL_CAPSULE | Freq: Every day | ORAL | Status: DC

## 2018-06-15 NOTE — H&P (Signed)
Obstetric History and Physical  Caitlin Rhodes is a 28 y.o. [phone] with IUP at 59w3dpresenting for fall on bottom and right side tonight. Having regular contractions and abdominal pain. Denies decreased fetal movement, leaking or bleeding.  Prenatal Course Source of Care: [facility] with onset of care at 21 weeks, [facility] P[facility] complications or risks: Patient Active Problem List   Diagnosis Date Noted  . Fall 06/15/2018  . Unwanted fertility 06/13/2018  . Preterm uterine contractions 04/27/2018  . Abnormal fetal ultrasound 03/22/2018  . No leakage of amniotic fluid into vagina 03/11/2018  . Supervision of normal pregnancy, antepartum 02/22/2018  . Seizure disorder ([place] 09/01/2012   She plans to breastfeed She desires bilateral tubal ligation for postpartum contraception.   Prenatal labs and studies: ABO, Rh: --/--/O POS (05/22 0542) Antibody: NEG (05/22 0542) Rubella: Immune (11/26 0000) RPR: Non Reactive (03/19 1022)  HBsAg: Negative (11/26 0000)  HIV: Non Reactive (03/19 1022)  GBS:  2 hr GTT:  normal Genetic screening normal Anatomy UKoreaabnormal with hyperechogenic bowel, right urinary tract dilation, normal follow up  Medical History:  Past Medical History:  Diagnosis Date  . Seizures ([place]     Past Surgical History:  Procedure Laterality Date  . NO PAST SURGERIES      OB History  Gravida Para Term Preterm AB Living  _0 0 1 3  SAB TAB Ectopic Multiple Live Births  1 0 0 0 3    # Outcome Date GA Lbr Len/2nd Weight Sex Delivery Anes PTL Lv  5 Current           4 SAB           3 Term      Vag-Spont     2 Term      Vag-Spont     1 Term      Vag-Spont       Social History   Socioeconomic History  . Marital status: Single    Spouse name: Not on file  . Number of children: Not on file  . Years of education: Not on file  . Highest education level: Not on file  Occupational History  . Not on file  Social  Needs  . Financial resource strain: Not on file  . Food insecurity:    Worry: Not on file    Inability: Not on file  . Transportation needs:    Medical: Not on file    Non-medical: Not on file  Tobacco Use  . Smoking status: Never Smoker  . Smokeless tobacco: Never Used  Substance and Sexual Activity  . Alcohol use: Not Currently  . Drug use: No  . Sexual activity: Yes    Partners: Male    Birth control/protection: None  Lifestyle  . Physical activity:    Days per week: Not on file    Minutes per session: Not on file  . Stress: Not on file  Relationships  . Social connections:    Talks on phone: Not on file    Gets together: Not on file    Attends religious service: Not on file    Active member of club or organization: Not on file    Attends meetings of clubs or organizations: Not on file    Relationship status: Not on file  Other Topics Concern  . Not on file  Social History Narrative  . Not on file    Family History  Problem Relation Age  of Onset  . Cancer Maternal Grandmother   . Cancer Paternal Grandmother     Medications Prior to Admission  Medication Sig Dispense Refill Last Dose  . Prenatal MV-Min-FA-Omega-3 (PRENATAL GUMMIES/DHA & FA) 0.4-32.5 MG CHEW Chew by mouth.   06/14/2018 at Unknown time  . Probiotic Product (PROBIOTIC PO) Take by mouth.   06/14/2018 at Unknown time  . Blood Pressure Monitor KIT 1 Device by Does not apply route daily. 1 each 0 Taking    No Known Allergies  Review of Systems: Negative except for what is mentioned in HPI.  Physical Exam: BP 118/78 (BP Location: Left Arm)   Pulse 88   Temp 98.4 F (36.9 C) (Oral)   Resp 18   Ht _0  (1.702 m)   Wt 68 kg   LMP 09/27/2017 (Exact Date)   SpO2 100%   BMI 23.49 kg/m  CONSTITUTIONAL: Well-developed, well-nourished female in no acute distress.  HENT:  Normocephalic, atraumatic, External right and left ear normal. Oropharynx is clear and moist EYES: Conjunctivae and EOM are  normal. Pupils are equal, round, and reactive to light. No scleral icterus.  NECK: Normal range of motion, supple, no masses SKIN: Skin is warm and dry. No rash noted. Not diaphoretic. No erythema. No pallor. NEUROLOGIC: Alert and oriented to person, place, and time. Normal reflexes, muscle tone coordination. No cranial nerve deficit noted. PSYCHIATRIC: Normal mood and affect. Normal behavior. Normal judgment and thought content. CARDIOVASCULAR: Normal heart rate noted, regular rhythm RESPIRATORY: Effort and breath sounds normal, no problems with respiration noted ABDOMEN: Soft, moderately tender on RLQ, nondistended, gravid. MUSCULOSKELETAL: Normal range of motion. No edema and no tenderness. 2+ distal pulses.  Cervical Exam: Dilatation 0.5cm   Effacement 30%   Station ballotable   Presentation: cephalic FHT:  Baseline rate 155 bpm   Variability moderate  Accelerations present   Decelerations none Contractions: Every few mins   Pertinent Labs/Studies:   Results for orders placed or performed during the hospital encounter of 06/14/18 (from the past 24 hour(s))  Type and screen [place]     Status: None   Collection Time: 06/15/18  5:42 AM  Result Value Ref Range   ABO/RH(D) O POS    Antibody Screen NEG    Sample Expiration      06/18/2018,2359 Performed at [facility], [place] [address], [place],  [address]     Assessment : Caitlin Rhodes is a 28 y.o. [phone] at 74w3dbeing admitted for contractions after fall. No direct abdominal trauma but significantly tender on RLQ. Reviewed plan for 23 hour observation given contractions after fall, risks of labor/abruption. She is agreeable to plan.  Plan:  [facility] - continuous toco/EFM - regular diet - will watch closely for signs of abruption, fetal intolerance or labor    K. MArvilla Meres M.D. Attending [facility] [employer](Faculty Practice)   06/15/2018, 8:25 AM

## 2018-06-16 DIAGNOSIS — O9989 Other specified diseases and conditions complicating pregnancy, childbirth and the puerperium: Secondary | ICD-10-CM | POA: Diagnosis not present

## 2018-06-16 NOTE — Plan of Care (Signed)
Discharge instructions reviewed w/ pt & family.  Pt verbalized understanding & was given a physical copy of instructions as well.   Problem: Clinical Measurements: Goal: Ability to maintain clinical measurements within normal limits will improve Outcome: Completed/Met

## 2018-06-16 NOTE — Discharge Summary (Signed)
Physician Discharge Summary  Patient ID: Caitlin Rhodes MRN: [id number] DOB/AGE: [birth year] 28 y.o.  Admit date: 06/14/2018 Discharge date: 06/16/2018  Admission Diagnoses: Fall in pregnancy Discharge Diagnoses:  Active Problems:   Fall   Discharged Condition: stable  Hospital Course: unremarkable hospital course, no evidence of abruption, normal FHR surveillance  Consults:   Significant Diagnostic Studies: sonogram and labs  Treatments: observation  Discharge Exam: Blood pressure 97/61, pulse 74, temperature 98.1 F (36.7 C), temperature source Oral, resp. rate 17, height 5' 7"  (1.702 m), weight 68 kg, last menstrual period 09/27/2017, SpO2 100 %. General appearance: alert, cooperative and no distress GI: soft, non-tender; bowel sounds normal; no masses,  no organomegaly  Disposition: Discharge disposition: 01-Home or Self Care       Discharge Instructions    Diet - low sodium heart healthy   Complete by:  As directed    Increase activity slowly   Complete by:  As directed      Allergies as of 06/16/2018   No Known Allergies     Medication List    TAKE these medications   Blood Pressure Monitor Kit 1 Device by Does not apply route daily.   Prenatal Gummies/DHA & FA 0.4-32.5 MG Chew Chew by mouth.   PROBIOTIC PO Take by mouth.      Follow-up Information    [facility] Follow up.   Specialty:  Obstetrics and Gynecology Why:  keep scheduled Contact information: [address] [place] [phone]          Signed: Florian Buff 06/16/2018, 7:58 AM

## 2018-06-16 NOTE — Discharge Instructions (Signed)
Preventing Injuries During Pregnancy ° °Injuries can happen during pregnancy. Minor falls and accidents usually do not harm you or your baby. But some injuries can harm you and your baby. Tell your doctor about any injury you suffer. °What can I do to avoid injuries? °Safety °· Remove rugs and loose objects on the floor. °· Wear comfortable shoes that have a good grip. Do not wear shoes that have high heels. °· Always wear your seat belt in the car. The lap belt should be below your belly. Always drive safely. °· Do not ride on a motorcycle. °Activity °· Do not take part in rough and violent activities or sports. °· Avoid: °? Walking on wet or slippery floors. °? Lifting heavy pots of boiling or hot liquids. °? Fixing electrical problems. °? Being near fires. °General instructions °· Take over-the-counter and prescription medicines only as told by your doctor. °· Know your blood type and the blood type of the baby's father. °· If you are a victim of domestic violence: °? Call your local emergency services (911 in the [place] °? Contact the National Domestic Violence Hotline for help and support. °Get help right away if: °· You fall on your belly or receive any serious blow to your belly. °· You have a stiff neck or neck pain after a fall or an injury. °· You get a headache or have problems with vision after an injury. °· You do not feel the baby move or the baby is not moving as much as normal. °· You have been a victim of domestic violence or any other kind of attack. °· You have been in a car accident. °· You have bleeding from your vagina. °· Fluid is leaking from your vagina. °· You start to have cramping or pain in your belly (contractions). °· You have very bad pain in your lower back. °· You feel weak or pass out (faint). °· You start to throw up (vomit) after an injury. °· You have been burned. °Summary °· Some injuries that happen during pregnancy can do harm to the baby. °· Tell your doctor about any  injury. °· Take steps to avoid injury. This includes removing rugs and loose objects on the floor. Always wear your seat belt in the car. °· Do not take part in rough and violent activities or sports. °· Get help right away if you have any serious accident or injury. °This information is not intended to replace advice given to you by your health care provider. Make sure you discuss any questions you have with your health care provider. °Document Released: 02/12/2010 Document Revised: 01/20/2016 Document Reviewed: 01/20/2016 °Elsevier Interactive Patient Education © 2019 Elsevier Inc. ° °

## 2018-06-17 LAB — CULTURE, BETA STREP (GROUP B ONLY): Strep Gp B Culture: NEGATIVE

## 2018-06-20 ENCOUNTER — Other Ambulatory Visit: Payer: Self-pay | Admitting: Certified Nurse Midwife

## 2018-06-20 ENCOUNTER — Encounter: Payer: Self-pay | Admitting: *Deleted

## 2018-06-20 ENCOUNTER — Other Ambulatory Visit: Payer: Self-pay | Admitting: *Deleted

## 2018-06-20 DIAGNOSIS — Z3480 Encounter for supervision of other normal pregnancy, unspecified trimester: Secondary | ICD-10-CM

## 2018-06-20 DIAGNOSIS — O26899 Other specified pregnancy related conditions, unspecified trimester: Secondary | ICD-10-CM

## 2018-06-20 MED ORDER — CYCLOBENZAPRINE HCL 10 MG PO TABS: 10.0000 mg | ORAL_TABLET | Freq: Three times a day (TID) | ORAL | 0 refills | Status: DC | PRN

## 2018-06-20 MED ORDER — COMFORT FIT MATERNITY SUPP LG MISC: 1.0000 | Freq: Once | 0 refills | Status: AC

## 2018-06-20 NOTE — Progress Notes (Signed)
Order entered for [employer] belt.

## 2018-06-22 ENCOUNTER — Encounter: Payer: Self-pay | Admitting: *Deleted

## 2018-06-27 ENCOUNTER — Other Ambulatory Visit: Payer: Self-pay

## 2018-06-27 ENCOUNTER — Telehealth (INDEPENDENT_AMBULATORY_CARE_PROVIDER_SITE_OTHER): Payer: BC Managed Care – PPO | Admitting: Obstetrics

## 2018-06-27 ENCOUNTER — Telehealth: Payer: BLUE CROSS/BLUE SHIELD

## 2018-06-27 ENCOUNTER — Encounter: Payer: Self-pay | Admitting: Obstetrics

## 2018-06-27 DIAGNOSIS — Z3480 Encounter for supervision of other normal pregnancy, unspecified trimester: Secondary | ICD-10-CM

## 2018-06-27 DIAGNOSIS — Z3A38 38 weeks gestation of pregnancy: Secondary | ICD-10-CM

## 2018-06-27 DIAGNOSIS — Z3483 Encounter for supervision of other normal pregnancy, third trimester: Secondary | ICD-10-CM

## 2018-06-27 NOTE — Progress Notes (Signed)
Televisit ROB Pt unavailable for Webex visit.

## 2018-06-27 NOTE — Progress Notes (Signed)
   TELEHEALTH VIRTUAL OBSTETRICS VISIT ENCOUNTER NOTE  I connected with Caitlin Rhodes on 06/27/18 at  1:45 PM EDT by telephone at home and verified that I am speaking with the correct person using two identifiers.   I discussed the limitations, risks, security and privacy concerns of performing an evaluation and management service by telephone and the availability of in person appointments. I also discussed with the patient that there may be a patient responsible charge related to this service. The patient expressed understanding and agreed to proceed.  Subjective:  Caitlin Rhodes is a 28 y.o. [id number] at [redacted]w[redacted]d being followed for ongoing prenatal care.  She is currently monitored for the following issues for this low-risk pregnancy and has Seizure disorder (HCC); Supervision of normal pregnancy, antepartum; No leakage of amniotic fluid into vagina; Abnormal fetal ultrasound; Preterm uterine contractions; Unwanted fertility; and Fall on their problem list.  Patient reports no complaints. Reports fetal movement. Denies any contractions, bleeding or leaking of fluid.   The following portions of the patient's history were reviewed and updated as appropriate: allergies, current medications, past family history, past medical history, past social history, past surgical history and problem list.   Objective:   General:  Alert, oriented and cooperative.   Mental Status: Normal mood and affect perceived. Normal judgment and thought content.  Rest of physical exam deferred due to type of encounter  Assessment and Plan:  Pregnancy: G5P3013 at [redacted]w[redacted]d 1. Supervision of other normal pregnancy, antepartum    Term labor symptoms and general obstetric precautions including but not limited to vaginal bleeding, contractions, leaking of fluid and fetal movement were reviewed in detail with the patient.  I discussed the assessment and treatment plan with the patient. The patient was provided an  opportunity to ask questions and all were answered. The patient agreed with the plan and demonstrated an understanding of the instructions. The patient was advised to call back or seek an in-person office evaluation/go to [facility] at [facility] for any urgent or concerning symptoms. Please refer to After Visit Summary for other counseling recommendations.   I provided 10 minutes of non-face-to-face time during this encounter.  Return in about 1 week (around 07/04/2018) for ROB in office.  No future appointments.  Coral Ceo, MD Center for [facility], [facility] 06-27-2018

## 2018-07-04 ENCOUNTER — Encounter (HOSPITAL_COMMUNITY): Payer: Self-pay | Admitting: *Deleted

## 2018-07-04 ENCOUNTER — Inpatient Hospital Stay (EMERGENCY_DEPARTMENT_HOSPITAL)
Admission: AD | Admit: 2018-07-04 | Discharge: 2018-07-04 | Disposition: A | Discharge status: Home / Self Care | Payer: BC Managed Care – PPO | Source: Home / Self Care | Attending: Obstetrics & Gynecology | Admitting: Obstetrics & Gynecology

## 2018-07-04 ENCOUNTER — Inpatient Hospital Stay (HOSPITAL_COMMUNITY): Payer: BC Managed Care – PPO | Admitting: Anesthesiology

## 2018-07-04 ENCOUNTER — Encounter (HOSPITAL_COMMUNITY): Payer: Self-pay

## 2018-07-04 ENCOUNTER — Encounter: Payer: BC Managed Care – PPO | Admitting: Certified Nurse Midwife

## 2018-07-04 ENCOUNTER — Inpatient Hospital Stay (HOSPITAL_COMMUNITY)
Admission: AD | Admit: 2018-07-04 | Discharge: 2018-07-06 | DRG: 806 | Disposition: A | Discharge status: Home / Self Care | Payer: BC Managed Care – PPO | Attending: Obstetrics and Gynecology | Admitting: Obstetrics and Gynecology

## 2018-07-04 ENCOUNTER — Other Ambulatory Visit: Payer: Self-pay

## 2018-07-04 DIAGNOSIS — O9902 Anemia complicating childbirth: Secondary | ICD-10-CM | POA: Diagnosis present

## 2018-07-04 DIAGNOSIS — Z3A39 39 weeks gestation of pregnancy: Secondary | ICD-10-CM

## 2018-07-04 DIAGNOSIS — G40909 Epilepsy, unspecified, not intractable, without status epilepticus: Secondary | ICD-10-CM | POA: Diagnosis present

## 2018-07-04 DIAGNOSIS — Z1159 Encounter for screening for other viral diseases: Secondary | ICD-10-CM

## 2018-07-04 DIAGNOSIS — O283 Abnormal ultrasonic finding on antenatal screening of mother: Secondary | ICD-10-CM | POA: Diagnosis present

## 2018-07-04 DIAGNOSIS — D649 Anemia, unspecified: Secondary | ICD-10-CM | POA: Diagnosis present

## 2018-07-04 DIAGNOSIS — O479 False labor, unspecified: Secondary | ICD-10-CM

## 2018-07-04 DIAGNOSIS — Z3480 Encounter for supervision of other normal pregnancy, unspecified trimester: Secondary | ICD-10-CM

## 2018-07-04 DIAGNOSIS — O26893 Other specified pregnancy related conditions, third trimester: Secondary | ICD-10-CM | POA: Diagnosis present

## 2018-07-04 DIAGNOSIS — O99354 Diseases of the nervous system complicating childbirth: Secondary | ICD-10-CM | POA: Diagnosis present

## 2018-07-04 DIAGNOSIS — O471 False labor at or after 37 completed weeks of gestation: Secondary | ICD-10-CM | POA: Insufficient documentation

## 2018-07-04 DIAGNOSIS — Z0371 Encounter for suspected problem with amniotic cavity and membrane ruled out: Secondary | ICD-10-CM

## 2018-07-04 LAB — CBC
HCT: 33.1 % — ABNORMAL LOW (ref 36.0–46.0)
Hemoglobin: 10.9 g/dL — ABNORMAL LOW (ref 12.0–15.0)
MCH: 29.6 pg (ref 26.0–34.0)
MCHC: 32.9 g/dL (ref 30.0–36.0)
MCV: 89.9 fL (ref 80.0–100.0)
Platelets: 261 10*3/uL (ref 150–400)
RBC: 3.68 MIL/uL — ABNORMAL LOW (ref 3.87–5.11)
RDW: 12.9 % (ref 11.5–15.5)
WBC: 10.9 10*3/uL — ABNORMAL HIGH (ref 4.0–10.5)
nRBC: 0 % (ref 0.0–0.2)

## 2018-07-04 LAB — TYPE AND SCREEN
ABO/RH(D): O POS
Antibody Screen: NEGATIVE

## 2018-07-04 LAB — SARS CORONAVIRUS 2: SARS Coronavirus 2: NOT DETECTED

## 2018-07-04 MED ORDER — DIBUCAINE (PERIANAL) 1 % EX OINT: 1.0000 "application " | TOPICAL_OINTMENT | CUTANEOUS | Status: DC | PRN

## 2018-07-04 MED ORDER — LACTATED RINGERS IV SOLN: 500.0000 mL | Freq: Once | INTRAVENOUS | Status: DC

## 2018-07-04 MED ORDER — ONDANSETRON HCL 4 MG PO TABS: 4.0000 mg | ORAL_TABLET | ORAL | Status: DC | PRN

## 2018-07-04 MED ORDER — SOD CITRATE-CITRIC ACID 500-334 MG/5ML PO SOLN: 30.0000 mL | ORAL | Status: DC | PRN

## 2018-07-04 MED ORDER — FENTANYL CITRATE (PF) 100 MCG/2ML IJ SOLN: 50.0000 ug | INTRAMUSCULAR | Status: DC | PRN

## 2018-07-04 MED ORDER — EPHEDRINE 5 MG/ML INJ: 10.0000 mg | INTRAVENOUS | Status: DC | PRN

## 2018-07-04 MED ORDER — BENZOCAINE-MENTHOL 20-0.5 % EX AERO: 1.0000 "application " | INHALATION_SPRAY | CUTANEOUS | Status: DC | PRN

## 2018-07-04 MED ORDER — ACETAMINOPHEN 325 MG PO TABS: 650.0000 mg | ORAL_TABLET | ORAL | Status: DC | PRN

## 2018-07-04 MED ORDER — PHENYLEPHRINE 40 MCG/ML (10ML) SYRINGE FOR IV PUSH (FOR BLOOD PRESSURE SUPPORT): 80.0000 ug | PREFILLED_SYRINGE | INTRAVENOUS | Status: DC | PRN

## 2018-07-04 MED ORDER — ONDANSETRON HCL 4 MG/2ML IJ SOLN: 4.0000 mg | Freq: Four times a day (QID) | INTRAMUSCULAR | Status: DC | PRN

## 2018-07-04 MED ORDER — SODIUM CHLORIDE (PF) 0.9 % IJ SOLN
INTRAMUSCULAR | Status: DC | PRN
  Administered 2018-07-04: 14 mL/h via EPIDURAL

## 2018-07-04 MED ORDER — LIDOCAINE HCL (PF) 1 % IJ SOLN: 30.0000 mL | INTRAMUSCULAR | Status: DC | PRN

## 2018-07-04 MED ORDER — ONDANSETRON HCL 4 MG/2ML IJ SOLN: 4.0000 mg | INTRAMUSCULAR | Status: DC | PRN

## 2018-07-04 MED ORDER — PRENATAL MULTIVITAMIN CH
1.0000 | ORAL_TABLET | Freq: Every day | ORAL | Status: DC
  Administered 2018-07-05: 12:00:00 1 via ORAL
  Filled 2018-07-04: qty 1

## 2018-07-04 MED ORDER — OXYTOCIN 40 UNITS IN NORMAL SALINE INFUSION - SIMPLE MED
2.5000 [IU]/h | INTRAVENOUS | Status: DC
  Filled 2018-07-04: qty 1000

## 2018-07-04 MED ORDER — MEASLES, MUMPS & RUBELLA VAC IJ SOLR: 0.5000 mL | Freq: Once | INTRAMUSCULAR | Status: DC

## 2018-07-04 MED ORDER — COCONUT OIL OIL
1.0000 "application " | TOPICAL_OIL | Status: DC | PRN
  Administered 2018-07-05: 1 via TOPICAL

## 2018-07-04 MED ORDER — WITCH HAZEL-GLYCERIN EX PADS: 1.0000 "application " | MEDICATED_PAD | CUTANEOUS | Status: DC | PRN

## 2018-07-04 MED ORDER — ZOLPIDEM TARTRATE 5 MG PO TABS: 5.0000 mg | ORAL_TABLET | Freq: Every evening | ORAL | Status: DC | PRN

## 2018-07-04 MED ORDER — SENNOSIDES-DOCUSATE SODIUM 8.6-50 MG PO TABS
2.0000 | ORAL_TABLET | ORAL | Status: DC
  Administered 2018-07-06: 2 via ORAL
  Filled 2018-07-04 (×2): qty 2

## 2018-07-04 MED ORDER — LIDOCAINE HCL (PF) 1 % IJ SOLN
INTRAMUSCULAR | Status: DC | PRN
  Administered 2018-07-04 (×2): 5 mL via EPIDURAL

## 2018-07-04 MED ORDER — FENTANYL-BUPIVACAINE-NACL 0.5-0.125-0.9 MG/250ML-% EP SOLN
12.0000 mL/h | EPIDURAL | Status: DC | PRN
  Filled 2018-07-04: qty 250

## 2018-07-04 MED ORDER — LACTATED RINGERS IV SOLN
INTRAVENOUS | Status: DC
  Administered 2018-07-04: 15:00:00 via INTRAVENOUS

## 2018-07-04 MED ORDER — SIMETHICONE 80 MG PO CHEW: 80.0000 mg | CHEWABLE_TABLET | ORAL | Status: DC | PRN

## 2018-07-04 MED ORDER — LACTATED RINGERS IV SOLN
500.0000 mL | INTRAVENOUS | Status: DC | PRN
  Administered 2018-07-04: 500 mL via INTRAVENOUS

## 2018-07-04 MED ORDER — IBUPROFEN 600 MG PO TABS
600.0000 mg | ORAL_TABLET | Freq: Four times a day (QID) | ORAL | Status: DC
  Administered 2018-07-04 – 2018-07-06 (×6): 600 mg via ORAL
  Filled 2018-07-04 (×6): qty 1

## 2018-07-04 MED ORDER — DIPHENHYDRAMINE HCL 50 MG/ML IJ SOLN: 12.5000 mg | INTRAMUSCULAR | Status: DC | PRN

## 2018-07-04 MED ORDER — TETANUS-DIPHTH-ACELL PERTUSSIS 5-2.5-18.5 LF-MCG/0.5 IM SUSP: 0.5000 mL | Freq: Once | INTRAMUSCULAR | Status: DC

## 2018-07-04 MED ORDER — DIPHENHYDRAMINE HCL 25 MG PO CAPS: 25.0000 mg | ORAL_CAPSULE | Freq: Four times a day (QID) | ORAL | Status: DC | PRN

## 2018-07-04 MED ORDER — OXYTOCIN BOLUS FROM INFUSION
500.0000 mL | Freq: Once | INTRAVENOUS | Status: AC
  Administered 2018-07-04: 500 mL via INTRAVENOUS

## 2018-07-04 NOTE — Anesthesia Procedure Notes (Signed)
Epidural Patient location during procedure: OB Start time: 07/04/2018 4:02 PM End time: 07/04/2018 4:12 PM  Staffing Anesthesiologist: Murvin Natal, MD Performed: anesthesiologist   Preanesthetic Checklist Completed: patient identified, site marked, pre-op evaluation, timeout performed, IV checked, risks and benefits discussed and monitors and equipment checked  Epidural Patient position: sitting Prep: DuraPrep Patient monitoring: heart rate, cardiac monitor, continuous pulse ox and blood pressure Approach: midline Location: L4-L5 Injection technique: LOR air  Needle:  Needle type: Tuohy  Needle gauge: 17 G Needle length: 9 cm Needle insertion depth: 5 cm Catheter type: closed end flexible Catheter size: 19 Gauge Catheter at skin depth: 10 cm Test dose: negative and Other  Assessment Events: blood not aspirated, injection not painful, no injection resistance and negative IV test  Additional Notes Informed consent obtained prior to proceeding including risk of failure, 1% risk of PDPH, risk of minor discomfort and bruising. Discussed alternatives to epidural analgesia and patient desires to proceed.  Timeout performed pre-procedure verifying patient name, procedure, and platelet count.  Patient tolerated procedure well. Reason for block:procedure for pain

## 2018-07-04 NOTE — Discharge Summary (Addendum)
OB Discharge Summary     Patient Name: Caitlin Rhodes DOB: [birth year] MRN: [id number]  Date of admission: 07/04/2018 Delivering MD: Nicolette Bang   Date of discharge: 07/06/2018  Admitting diagnosis: CTX Intrauterine pregnancy: [redacted]w[redacted]d    Secondary diagnosis:  Active Problems:   [place] ([place]   Abnormal fetal ultrasound   Indication for care or intervention in labor or delivery   SVD (spontaneous vaginal delivery)  Additional problems: none     Discharge diagnosis: Term Pregnancy Delivered                                                                                                Post partum procedures:none  Augmentation: AROM  Complications: None  Hospital course:  Onset of Labor With Vaginal Delivery     28y.o. yo [id number] 317w1das admitted in Active Labor on 07/04/2018. Patient had an uncomplicated labor course as follows:  Membrane Rupture Time/Date: 5:20 PM ,07/04/2018   Intrapartum Procedures: Episiotomy: None [1]                                         Lacerations:  None [1]  Patient had a delivery of a Viable infant. 07/04/2018  Information for the patient's newborn:  [name], [name][[id number]]Delivery Method: Vaginal, Spontaneous(Filed from Delivery Summary)    Pateint had an uncomplicated postpartum course.  She is ambulating, tolerating a regular diet, passing flatus, and urinating well. Patient is discharged home in stable condition on 07/06/18.   Physical exam  Vitals:   07/05/18 0539 07/05/18 1000 07/05/18 1605 07/05/18 2219  BP: 100/74 102/74 105/76 124/86  Pulse: 62 62 65 (!) 58  Resp: 18 16 17 18   Temp: 98 F (36.7 C) 97.8 F (36.6 C) 98.3 F (36.8 C) 98.6 F (37 C)  TempSrc: Oral Oral Oral Oral  SpO2: 100%      General: alert, cooperative and no distress Lochia: appropriate Uterine Fundus: firm Incision: Healing well with no significant drainage DVT Evaluation: No evidence of DVT seen on  physical exam. Labs: Lab Results  Component Value Date   WBC 10.9 (H) 07/04/2018   HGB 10.9 (L) 07/04/2018   HCT 33.1 (L) 07/04/2018   MCV 89.9 07/04/2018   PLT 261 07/04/2018   CMP Latest Ref Rng & Units 01/14/2016  Glucose 65 - 99 mg/dL 79  BUN 6 - 20 mg/dL 9  Creatinine 0.44 - 1.00 mg/dL 0.90  Sodium 135 - 145 mmol/L 139  Potassium 3.5 - 5.1 mmol/L 3.9  Chloride 101 - 111 mmol/L 103  CO2 19 - 32 mEq/L -  Calcium 8.4 - 10.5 mg/dL -  Total Protein 6.0 - 8.3 g/dL -  Total Bilirubin 0.3 - 1.2 mg/dL -  Alkaline Phos 39 - 117 U/L -  AST 0 - 37 U/L -  ALT 0 - 35 U/L -    Discharge instruction: per After Visit Summary and "Baby and Me Booklet".  After visit meds:  Allergies as of 07/06/2018   No Known Allergies     Medication List    STOP taking these medications   cyclobenzaprine 10 MG tablet Commonly known as: FLEXERIL     TAKE these medications   acetaminophen 325 MG tablet Commonly known as: Tylenol Take 2 tablets (650 mg total) by mouth every 4 (four) hours as needed (for pain scale < 4).   Blood Pressure Monitor Kit 1 Device by Does not apply route daily.   ibuprofen 600 MG tablet Commonly known as: ADVIL Take 1 tablet (600 mg total) by mouth every 6 (six) hours.   Prenatal Gummies/DHA & FA 0.4-32.5 MG Chew Chew by mouth.   PROBIOTIC PO Take by mouth.       Diet: routine diet  Activity: Advance as tolerated. Pelvic rest for 6 weeks.   Outpatient follow up:4 weeks Follow up Appt: Future Appointments  Date Time Provider [place]  08/06/2018 11:00 AM Constant, Vickii Chafe, MD [place] None   Follow up Visit:No follow-ups on file.  Please schedule this patient for Postpartum visit in: 4 weeks with the following provider: Any provider For C/S patients schedule nurse incision check in weeks 2 weeks: no Low risk pregnancy complicated by: seizure disorder  Delivery mode:  SVD Anticipated Birth Control:  Paragard PP Procedures needed: None    Schedule Integrated BH visit: no   Postpartum contraception: IUD Paragard  Newborn Data: Live born female  Birth Weight:   APGAR: 30, 9  Newborn Delivery   Birth date/time:  07/04/2018 19:09:00 Delivery type:  Vaginal, Spontaneous     Baby Feeding: Breast Disposition: home   07/06/2018 Wilber Oliphant, MD  OB FELLOW DISCHARGE ATTESTATION  I have seen and examined this patient and agree with above documentation in the resident's note.   Phill Myron, D.O. OB Fellow  07/06/2018, 8:02 AM

## 2018-07-04 NOTE — MAU Note (Signed)
Pt presents to MAU with c/o ctx that started today around 4 am. She denies VB and LOF. +FM

## 2018-07-04 NOTE — MAU Note (Signed)
Pt ctx closer together now. Pain 10/10.

## 2018-07-04 NOTE — Progress Notes (Signed)
LABOR PROGRESS NOTE  Caitlin Rhodes is a 28 y.o. B2W4132 at [redacted]w[redacted]d  admitted for SOL.   Subjective: Comfortable with epidural. No questions.   Objective: BP 106/76   Pulse 78   Temp 98.5 F (36.9 C) (Oral)   Resp 20   LMP 09/27/2017 (Exact Date)   SpO2 99%  or  Vitals:   07/04/18 1635 07/04/18 1640 07/04/18 1644 07/04/18 1700  BP: 116/82 115/79 122/83 106/76  Pulse: 84 90 85 78  Resp:    20  Temp:      TempSrc:      SpO2: 100% 99%      Dilation: 7 Effacement (%): 90 Cervical Position: Middle Station: 0, Plus 1 Presentation: Vertex Exam by:: CJuleen [place] FHT: baseline rate 130, moderate varibility, +acel, no decel Toco: q2-5 min   Labs: Lab Results  Component Value Date   WBC 10.9 (H) 07/04/2018   HGB 10.9 (L) 07/04/2018   HCT 33.1 (L) 07/04/2018   MCV 89.9 07/04/2018   PLT 261 07/04/2018    Patient Active Problem List   Diagnosis Date Noted  . Indication for care or intervention in labor or delivery 07/04/2018  . Fall 06/15/2018  . Unwanted fertility 06/13/2018  . Preterm uterine contractions 04/27/2018  . Abnormal fetal ultrasound 03/22/2018  . No leakage of amniotic fluid into vagina 03/11/2018  . Supervision of normal pregnancy, antepartum 02/22/2018  . Seizure disorder ([place]) 09/01/2012    Assessment / Plan: 28 y.o. G4W1027 at [redacted]w[redacted]d here for SOL.   Labor: Changing without augmentation. AROM performed at 1715 with scant amount of clear fluid.  Fetal Wellbeing:  Cat I  Pain Control:  Epidural in place  Anticipated MOD:  NSVD   Phill Myron, D.O. OB Fellow  07/04/2018, 5:28 PM

## 2018-07-04 NOTE — H&P (Signed)
LABOR AND DELIVERY ADMISSION HISTORY AND PHYSICAL NOTE  Caitlin Rhodes is a 28 y.o. female [phone] with IUP at 31w1dby first trimester sono presenting for SOL. Patient in MAU a few hours prior to admission, was discharged home. Returned with cervical change with her contractions.  She reports positive fetal movement. She denies leakage of fluid or vaginal bleeding.  [facility] complications:  - abnormal initial fetal anatomy scan, resolved on repeat scan  -h/o seizure disorder, last seizure 2011 and not on medication   Past Medical History: Past Medical History:  Diagnosis Date  [place]     Past Surgical History: Past Surgical History:  Procedure Laterality Date  . NO PAST SURGERIES      Obstetrical History: OB History    Gravida  5   Para  3   Term  3   Preterm  0   AB  1   Living  3     SAB  1   TAB  0   Ectopic  0   Multiple  0   Live Births  3           Social History: Social History   Socioeconomic History  . Marital status: Single    Spouse name: Not on file  . Number of children: Not on file  . Years of education: Not on file  . Highest education level: Not on file  Occupational History  . Not on file  Social Needs  . Financial resource strain: Not on file  . Food insecurity:    Worry: Not on file    Inability: Not on file  . Transportation needs:    Medical: Not on file    Non-medical: Not on file  Tobacco Use  . Smoking status: Never Smoker  . Smokeless tobacco: Never Used  Substance and Sexual Activity  . Alcohol use: Not Currently  . Drug use: No  . Sexual activity: Yes    Partners: Male    Birth control/protection: None  Lifestyle  . Physical activity:    Days per week: Not on file    Minutes per session: Not on file  . Stress: Not on file  Relationships  . Social connections:    Talks on phone: Not on file    Gets together: Not on file    Attends  religious service: Not on file    Active member of club or organization: Not on file    Attends meetings of clubs or organizations: Not on file    Relationship status: Not on file  Other Topics Concern  . Not on file  Social History Narrative  . Not on file    Family History: Family History  Problem Relation Age of Onset  . Cancer Maternal Grandmother   . Cancer Paternal Grandmother     Allergies: No Known Allergies  Medications Prior to Admission  Medication Sig Dispense Refill Last Dose  . Blood Pressure Monitor KIT 1 Device by Does not apply route daily. 1 each 0 Taking  . cyclobenzaprine (FLEXERIL) 10 MG tablet Take 1 tablet (10 mg total) by mouth 3 (three) times daily as needed for muscle spasms. (Patient not taking: Reported on 06/27/2018) 20 tablet 0 Not Taking  . Prenatal MV-Min-FA-Omega-3 (PRENATAL GUMMIES/DHA & FA) 0.4-32.5 MG CHEW Chew by mouth.   Taking  . Probiotic Product (PROBIOTIC PO) Take by mouth.   Taking     Review of Systems  All  systems reviewed and negative except as stated in HPI  Physical Exam Blood pressure 122/79, pulse 81, temperature 98.5 F (36.9 C), temperature source Oral, resp. rate 20, last menstrual period 09/27/2017. General appearance: alert, oriented, NAD Lungs: normal respiratory effort Heart: regular rate Abdomen: soft, non-tender; gravid, FH appropriate for GA Extremities: No calf swelling or tenderness Presentation: cephalic Fetal monitoring:  Uterine activity: 130 bpm, moderate variability, +acels, no decels  Dilation: 5.5 Effacement (%): 80 Station: 0 Exam by:: Milinda Cave CNM  Prenatal labs: ABO, Rh: --/--/O POS, O POS (05/22 0542) Antibody: NEG (05/22 0542) Rubella: Immune (11/26 0000) RPR: Non Reactive (03/19 1022)  HBsAg: Negative (11/26 0000)  HIV: Non Reactive (03/19 1022)  GC/Chlamydia: Negative  GBS:   Negative  2-hr GTT: Normal  Genetic screening:  Normal  Anatomy US: initial with hyperechogenic bowel and mild  right urinary tract dilation, resolved on repeat scan   Prenatal Transfer Tool  Maternal Diabetes: No Genetic Screening: Normal Maternal Ultrasounds/Referrals: Normal Fetal Ultrasounds or other Referrals:  None Maternal Substance Abuse:  No Significant Maternal Medications:  None Significant Maternal Lab Results: Lab values include: Group B Strep negative  No results found for this or any previous visit (from the past 24 hour(s)).  Patient Active Problem List   Diagnosis Date Noted  . Indication for care or intervention in labor or delivery 07/04/2018  . Fall 06/15/2018  . Unwanted fertility 06/13/2018  . Preterm uterine contractions 04/27/2018  . Abnormal fetal ultrasound 03/22/2018  . No leakage of amniotic fluid into vagina 03/11/2018  . Supervision of normal pregnancy, antepartum 02/22/2018  . Seizure disorder ([place]) 09/01/2012    Assessment: ROSELLA Rhodes is a 28 y.o. [id number] at 52w1dhere for SOL.   #Labor: Expectant management.  #Pain: Plans for epidural  #FWB: Cat I  #ID:  GBS neg  #MOF: Breast  #MOC:Paragard. Signed BTL papers but has changed mind.  #Circ:  N/A   CMelina Schools6/10/2018, 3:31 PM

## 2018-07-04 NOTE — Discharge Instructions (Signed)
Fetal Movement Counts Patient Name: ________________________________________________ Patient Due Date: ____________________ What is a fetal movement count?  A fetal movement count is the number of times that you feel your baby move during a certain amount of time. This may also be called a fetal kick count. A fetal movement count is recommended for every pregnant woman. You may be asked to start counting fetal movements as early as week 28 of your pregnancy. Pay attention to when your baby is most active. You may notice your baby's sleep and wake cycles. You may also notice things that make your baby move more. You should do a fetal movement count:  When your baby is normally most active.  At the same time each day. A good time to count movements is while you are resting, after having something to eat and drink. How do I count fetal movements? 1. Find a quiet, comfortable area. Sit, or lie down on your side. 2. Write down the date, the start time and stop time, and the number of movements that you felt between those two times. Take this information with you to your health care visits. 3. For 2 hours, count kicks, flutters, swishes, rolls, and jabs. You should feel at least 10 movements during 2 hours. 4. You may stop counting after you have felt 10 movements. 5. If you do not feel 10 movements in 2 hours, have something to eat and drink. Then, keep resting and counting for 1 hour. If you feel at least 4 movements during that hour, you may stop counting. Contact a health care provider if:  You feel fewer than 4 movements in 2 hours.  Your baby is not moving like he or she usually does. Date: ____________ Start time: ____________ Stop time: ____________ Movements: ____________ Date: ____________ Start time: ____________ Stop time: ____________ Movements: ____________ Date: ____________ Start time: ____________ Stop time: ____________ Movements: ____________ Date: ____________ Start time:  ____________ Stop time: ____________ Movements: ____________ Date: ____________ Start time: ____________ Stop time: ____________ Movements: ____________ Date: ____________ Start time: ____________ Stop time: ____________ Movements: ____________ Date: ____________ Start time: ____________ Stop time: ____________ Movements: ____________ Date: ____________ Start time: ____________ Stop time: ____________ Movements: ____________ Date: ____________ Start time: ____________ Stop time: ____________ Movements: ____________ This information is not intended to replace advice given to you by your health care provider. Make sure you discuss any questions you have with your health care provider. Document Released: 02/09/2006 Document Revised: 09/09/2015 Document Reviewed: 02/19/2015 Elsevier Interactive Patient Education  2019 [employer] Braxton Hicks Contractions Contractions of the uterus can occur throughout pregnancy, but they are not always a sign that you are in labor. You may have practice contractions called Braxton Hicks contractions. These false labor contractions are sometimes confused with true labor. What are Braxton Hicks contractions? Braxton Hicks contractions are tightening movements that occur in the muscles of the uterus before labor. Unlike true labor contractions, these contractions do not result in opening (dilation) and thinning of the cervix. Toward the end of pregnancy (32-34 weeks), Braxton Hicks contractions can happen more often and may become stronger. These contractions are sometimes difficult to tell apart from true labor because they can be very uncomfortable. You should not feel embarrassed if you go to the hospital with false labor. Sometimes, the only way to tell if you are in true labor is for your health care provider to look for changes in the cervix. The health care provider will do a physical exam and may monitor your contractions. If   you are not in true labor, the exam  should show that your cervix is not dilating and your water has not broken. If there are no other health problems associated with your pregnancy, it is completely safe for you to be sent home with false labor. You may continue to have Braxton Hicks contractions until you go into true labor. How to tell the difference between true labor and false labor True labor  Contractions last 30-70 seconds.  Contractions become very regular.  Discomfort is usually felt in the top of the uterus, and it spreads to the lower abdomen and low back.  Contractions do not go away with walking.  Contractions usually become more intense and increase in frequency.  The cervix dilates and gets thinner. False labor  Contractions are usually shorter and not as strong as true labor contractions.  Contractions are usually irregular.  Contractions are often felt in the front of the lower abdomen and in the groin.  Contractions may go away when you walk around or change positions while lying down.  Contractions get weaker and are shorter-lasting as time goes on.  The cervix usually does not dilate or become thin. Follow these instructions at home:   Take over-the-counter and prescription medicines only as told by your health care provider.  Keep up with your usual exercises and follow other instructions from your health care provider.  Eat and drink lightly if you think you are going into labor.  If Braxton Hicks contractions are making you uncomfortable: ? Change your position from lying down or resting to walking, or change from walking to resting. ? Sit and rest in a tub of warm water. ? Drink enough fluid to keep your urine pale yellow. Dehydration may cause these contractions. ? Do slow and deep breathing several times an hour.  Keep all follow-up prenatal visits as told by your health care provider. This is important. Contact a health care provider if:  You have a fever.  You have continuous  pain in your abdomen. Get help right away if:  Your contractions become stronger, more regular, and closer together.  You have fluid leaking or gushing from your vagina.  You pass blood-tinged mucus (bloody show).  You have bleeding from your vagina.  You have low back pain that you never had before.  You feel your baby's head pushing down and causing pelvic pressure.  Your baby is not moving inside you as much as it used to. Summary  Contractions that occur before labor are called Braxton Hicks contractions, false labor, or practice contractions.  Braxton Hicks contractions are usually shorter, weaker, farther apart, and less regular than true labor contractions. True labor contractions usually become progressively stronger and regular, and they become more frequent.  Manage discomfort from Braxton Hicks contractions by changing position, resting in a warm bath, drinking plenty of water, or practicing deep breathing. This information is not intended to replace advice given to you by your health care provider. Make sure you discuss any questions you have with your health care provider. Document Released: 05/26/2016 Document Revised: 10/25/2016 Document Reviewed: 05/26/2016 Elsevier Interactive Patient Education  2019 Elsevier Inc.  

## 2018-07-04 NOTE — Anesthesia Preprocedure Evaluation (Signed)
Anesthesia Evaluation  Patient identified by MRN, date of birth, ID band Patient awake    Reviewed: Allergy & Precautions, H&P , NPO status , Patient's Chart, lab work & pertinent test results  History of Anesthesia Complications Negative for: history of anesthetic complications  Airway Mallampati: II  TM Distance: >3 FB Neck ROM: full    Dental no notable dental hx. (+) Teeth Intact   Pulmonary neg pulmonary ROS,    Pulmonary exam normal breath sounds clear to auscultation       Cardiovascular negative cardio ROS Normal cardiovascular exam Rhythm:regular Rate:Normal     Neuro/Psych Seizures -,  negative psych ROS   GI/Hepatic negative GI ROS, Neg liver ROS,   Endo/Other  negative endocrine ROS  Renal/GU negative Renal ROS  negative genitourinary   Musculoskeletal   Abdominal   Peds  Hematology  (+) Blood dyscrasia, anemia ,   Anesthesia Other Findings   Reproductive/Obstetrics (+) Pregnancy                             Anesthesia Physical Anesthesia Plan  ASA: II  Anesthesia Plan: Epidural   Post-op Pain Management:    Induction:   PONV Risk Score and Plan:   Airway Management Planned:   Additional Equipment:   Intra-op Plan:   Post-operative Plan:   Informed Consent: I have reviewed the patients History and Physical, chart, labs and discussed the procedure including the risks, benefits and alternatives for the proposed anesthesia with the patient or authorized representative who has indicated his/her understanding and acceptance.       Plan Discussed with:   Anesthesia Plan Comments:         Anesthesia Quick Evaluation

## 2018-07-04 NOTE — MAU Note (Signed)
I have communicated with Gavin Pound, CNM and reviewed vital signs:  Vitals:   07/04/18 1021 07/04/18 1145  BP: 113/77 113/77  Pulse: 89   Resp: 18   Temp: 98.2 F (36.8 C)   SpO2: 100%     Vaginal exam:  Dilation: 4 Effacement (%): 80 Cervical Position: Middle Station: -2 Presentation: Vertex Exam by:: J.Emly, CNM,   Also reviewed contraction pattern and that non-stress test is reactive.  It has been documented that patient is contracting every 4-9 minutes with minimal cervical change over 1.5 hours not indicating active labor.  Patient denies any other complaints.  Based on this report provider has given order for discharge.  A discharge order and diagnosis entered by a provider.   Labor discharge instructions reviewed with patient.

## 2018-07-04 NOTE — MAU Provider Note (Signed)
S: [name] is a 28 y.o. [phone] at [redacted]w[redacted]d  who presents to MAU today for labor evaluation.   Patient reports contractions started around 4 am and was initially Q 23mins, but is now every 5.  She endorses fetal movement and denies VB and LOF.  Nurse requests provider at bedside for CE due to difficulty.  Cervical exam: Dilation: 3.5 Effacement (%): 80 Cervical Position: Posterior Station: -2 Presentation: Vertex Exam by:: J.Nieko Clarin, CNM  Fetal Monitoring: Baseline: 130 Variability: Moderate Accelerations: Present Decelerations: None Contractions: Q2-9min  MDM Discussed patient with RN. NST reviewed.   A: SIUP at [redacted]w[redacted]d  Early Labor  P: Dilation: 4 Effacement (%): 80 Cervical Position: Middle Station: -2 Presentation: Vertex Exam by:: J.Jamarion [place], CNM  Follow up exam with some cervical change and position now middle. Patient given option for further evaluation or discharge home. Patient opts for discharge home. Patient may return to MAU as needed or when in active labor.   Gavin Pound, CNM 07/04/2018 12:21 PM

## 2018-07-05 LAB — RPR: RPR Ser Ql: NONREACTIVE

## 2018-07-05 NOTE — Anesthesia Postprocedure Evaluation (Signed)
Anesthesia Post Note  Patient: Caitlin Rhodes  Procedure(s) Performed: AN AD [place]     Patient location during evaluation: Mother Baby Anesthesia Type: Epidural Level of consciousness: awake, awake and alert and oriented Pain management: pain level controlled Vital Signs Assessment: post-procedure vital signs reviewed and stable Respiratory status: spontaneous breathing and nonlabored ventilation Postop Assessment: no headache, no backache, no apparent nausea or vomiting, adequate PO intake and able to ambulate Anesthetic complications: no    Last Vitals:  Vitals:   07/05/18 0220 07/05/18 0539  BP: 99/67 100/74  Pulse: 61 62  Resp: 18 18  Temp: 36.8 C 36.7 C  SpO2: 100% 100%    Last Pain:  Vitals:   07/05/18 0539  TempSrc: Oral  PainSc: 7    Pain Goal:                   [name]

## 2018-07-05 NOTE — Lactation Note (Signed)
This note was copied from a baby's chart. Lactation Consultation Note Mom's 4th baby. She has 3 older children that she BF for 10 months each.  Gave LPI information sheet and reviewed for supplementing d/t less than 6 lbs. Mom shown how to use DEBP & how to disassemble, clean, & reassemble parts. Mom knows to pump q3h for 15-20 min. Mom pumped 3 ml colostrum. Encouraged hand expression after pumping. Newborn behavior, milk storage, STS, I&O, supply and demand. Baby gagging occasionally, mom stated has spit up small amount a couple of times. Discussed w/mom the need to supplement w/some formula if unable to obtain enough colostrum. Mom in agreement. Curve tip syring and spoon given. Asked mom to call RN for guidance of feeding w/curve tip syring. Baby hadn't long feeding and was sleeping soundly. Similac 22 cal. To be given as needed after BF. Encouraged to call for assistance or further questions. Lactation brochure given.  Patient Name: Caitlin Rhodes [id number] Date: 07/05/2018 Reason for consult: Initial assessment;Term   Maternal Data Has patient been taught Hand Expression?: Yes Does the patient have breastfeeding experience prior to this delivery?: Yes  Feeding Feeding Type: Breast Fed  LATCH Score Latch: Grasps breast easily, tongue down, lips flanged, rhythmical sucking.  Audible Swallowing: A few with stimulation  Type of Nipple: Everted at rest and after stimulation  Comfort (Breast/Nipple): Soft / non-tender  Hold (Positioning): No assistance needed to correctly position infant at breast.  LATCH Score: 9  Interventions Interventions: Breast feeding basics reviewed;DEBP;Support pillows;Skin to skin;Position options;Breast massage;Expressed milk;Hand express;Breast compression  Lactation Tools Discussed/Used Tools: Pump Breast pump type: Double-Electric Breast Pump WIC Program: No Pump Review: Setup, frequency, and cleaning;Milk Storage Initiated by::  Allayne Stack RN IBCLC Date initiated:: 07/05/18   Consult Status Consult Status: Follow-up Date: 07/06/18 Follow-up type: In-patient    Doyce Saling, Elta [place] 07/05/2018, 4:09 AM

## 2018-07-05 NOTE — Lactation Note (Signed)
This note was copied from a baby's chart. Lactation Consultation Note  Patient Name: Caitlin Rhodes [id number] Date: 07/05/2018 Reason for consult: Follow-up assessment;Infant weight loss;Term  Baby is 97 hours old As LC entered room, grandmother holding baby and baby very fussy.  Mom in the bathroom, and LC checked and changed the diaper and it was dry.  LC assisted with pillows and preparing baby to feed STS.  Mom is and experienced breast feeder and mentioned her nipples are alittle sore.  LC reviewed ways to prevent .  LC observed mom latching the baby and she started out in the cradle position  And the baby noted to be latching shallow.  LC recommended and encouraged mom to try the cross cradle 1st and then  Switch her arms to the cradle to obtain a deep latch.  LC also recommended prior to latch warm moist heat on her breast  - breast massage,  hand express, pre-pump If needed and latch with compressions until swallows and then intermittent.  Mom receptive to review of breast feeding teaching. DEBP already set up in the room.    Maternal Data Has patient been taught Hand Expression?: Yes  Feeding Feeding Type: Breast Fed  LATCH Score Latch: Repeated attempts needed to sustain latch, nipple held in mouth throughout feeding, stimulation needed to elicit sucking reflex.(LC recommended cross cradle to cradle position)  Audible Swallowing: Spontaneous and intermittent  Type of Nipple: Everted at rest and after stimulation  Comfort (Breast/Nipple): Soft / non-tender  Hold (Positioning): No assistance needed to correctly position infant at breast.  LATCH Score: 9  Interventions Interventions: Breast feeding basics reviewed;Assisted with latch;Skin to skin;Breast massage;Hand express;Breast compression;Adjust position;Support pillows;Position options;DEBP  Lactation Tools Discussed/Used Breast pump type: Double-Electric Breast Pump Pump Review: Setup, frequency, and  cleaning;Other (comment)(L:C also provided colostrun containers for hand expressing.)   Consult Status Consult Status: Follow-up Date: 07/06/18 Follow-up type: [place] 07/05/2018, 6:34 PM

## 2018-07-05 NOTE — Progress Notes (Addendum)
Post Partum Day 1 Subjective: Pt is doing well this morning. Pt's only complaint is being sore and cramping, which is controlled with ibuprofen.  no complaints, up ad lib, voiding, tolerating PO and + flatus  Objective: Blood pressure 100/74, pulse 62, temperature 98 F (36.7 C), temperature source Oral, resp. rate 18, last menstrual period 09/27/2017, SpO2 100 %, unknown if currently breastfeeding.  Physical Exam:  General: alert, cooperative and no distress Lochia: appropriate Uterine Fundus: firm Incision: n/a DVT Evaluation: No evidence of DVT seen on physical exam. Negative Homan's sign. No cords or calf tenderness. No significant calf/ankle edema.  Recent Labs    07/04/18 1519  HGB 10.9*  HCT 33.1*    Assessment/Plan: Plan for discharge tomorrow, Breastfeeding and Contraception Paragard IUD   LOS: 1 day   Renee Harder, SNM 07/05/2018, 6:33 AM   I confirm that I have verified the information documented in the SNM's note and that I have also personally reperformed the physical exam and all medical decision making activities.  Patient was seen and examined by me also Agree with note Vitals stable Labs stable Fundus firm, lochia within normal limits Perineum healing Ext WNL Continue care Anticipate discharge tomorrow  Seabron Spates, CNM

## 2018-07-05 NOTE — Discharge Instructions (Signed)
Postpartum Care After Vaginal Delivery °This sheet gives you information about how to care for yourself from the time you deliver your baby to up to 6-12 weeks after delivery (postpartum period). Your health care provider may also give you more specific instructions. If you have problems or questions, contact your health care provider. °Follow these instructions at home: °Vaginal bleeding °· It is normal to have vaginal bleeding (lochia) after delivery. Wear a sanitary pad for vaginal bleeding and discharge. °? During the first week after delivery, the amount and appearance of lochia is often similar to a menstrual period. °? Over the next few weeks, it will gradually decrease to a dry, yellow-brown discharge. °? For most women, lochia stops completely by 4-6 weeks after delivery. Vaginal bleeding can vary from woman to woman. °· Change your sanitary pads frequently. Watch for any changes in your flow, such as: °? A sudden increase in volume. °? A change in color. °? Large blood clots. °· If you pass a blood clot from your vagina, save it and call your health care provider to discuss. Do not flush blood clots down the toilet before talking with your health care provider. °· Do not use tampons or douches until your health care provider says this is safe. °· If you are not breastfeeding, your period should return 6-8 weeks after delivery. If you are feeding your child breast milk only (exclusive breastfeeding), your period may not return until you stop breastfeeding. °Perineal care °· Keep the area between the vagina and the anus (perineum) clean and dry as told by your health care provider. Use medicated pads and pain-relieving sprays and creams as directed. °· If you had a cut in the perineum (episiotomy) or a tear in the vagina, check the area for signs of infection until you are healed. Check for: °? More redness, swelling, or pain. °? Fluid or blood coming from the cut or tear. °? Warmth. °? Pus or a bad  smell. °· You may be given a squirt bottle to use instead of wiping to clean the perineum area after you go to the bathroom. As you start healing, you may use the squirt bottle before wiping yourself. Make sure to wipe gently. °· To relieve pain caused by an episiotomy, a tear in the vagina, or swollen veins in the anus (hemorrhoids), try taking a warm sitz bath 2-3 times a day. A sitz bath is a warm water bath that is taken while you are sitting down. The water should only come up to your hips and should cover your buttocks. °Breast care °· Within the first few days after delivery, your breasts may feel heavy, full, and uncomfortable (breast engorgement). Milk may also leak from your breasts. Your health care provider can suggest ways to help relieve the discomfort. Breast engorgement should go away within a few days. °· If you are breastfeeding: °? Wear a bra that supports your breasts and fits you well. °? Keep your nipples clean and dry. Apply creams and ointments as told by your health care provider. °? You may need to use breast pads to absorb milk that leaks from your breasts. °? You may have uterine contractions every time you breastfeed for up to several weeks after delivery. Uterine contractions help your uterus return to its normal size. °? If you have any problems with breastfeeding, work with your health care provider or lactation consultant. °· If you are not breastfeeding: °? Avoid touching your breasts a lot. Doing this can make   your breasts produce more milk. °? Wear a good-fitting bra and use cold packs to help with swelling. °? Do not squeeze out (express) milk. This causes you to make more milk. °Intimacy and sexuality °· Ask your health care provider when you can engage in sexual activity. This may depend on: °? Your risk of infection. °? How fast you are healing. °? Your comfort and desire to engage in sexual activity. °· You are able to get pregnant after delivery, even if you have not had  your period. If desired, talk with your health care provider about methods of birth control (contraception). °Medicines °· Take over-the-counter and prescription medicines only as told by your health care provider. °· If you were prescribed an antibiotic medicine, take it as told by your health care provider. Do not stop taking the antibiotic even if you start to feel better. °Activity °· Gradually return to your normal activities as told by your health care provider. Ask your health care provider what activities are safe for you. °· Rest as much as possible. Try to rest or take a nap while your baby is sleeping. °Eating and drinking ° °· Drink enough fluid to keep your urine pale yellow. °· Eat high-fiber foods every day. These may help prevent or relieve constipation. High-fiber foods include: °? Whole grain cereals and breads. °? Brown rice. °? Beans. °? Fresh fruits and vegetables. °· Do not try to lose weight quickly by cutting back on calories. °· Take your prenatal vitamins until your postpartum checkup or until your health care provider tells you it is okay to stop. °Lifestyle °· Do not use any products that contain nicotine or tobacco, such as cigarettes and e-cigarettes. If you need help quitting, ask your health care provider. °· Do not drink alcohol, especially if you are breastfeeding. °General instructions °· Keep all follow-up visits for you and your baby as told by your health care provider. Most women visit their health care provider for a postpartum checkup within the first 3-6 weeks after delivery. °Contact a health care provider if: °· You feel unable to cope with the changes that your child brings to your life, and these feelings do not go away. °· You feel unusually sad or worried. °· Your breasts become red, painful, or hard. °· You have a fever. °· You have trouble holding urine or keeping urine from leaking. °· You have little or no interest in activities you used to enjoy. °· You have not  breastfed at all and you have not had a menstrual period for 12 weeks after delivery. °· You have stopped breastfeeding and you have not had a menstrual period for 12 weeks after you stopped breastfeeding. °· You have questions about caring for yourself or your baby. °· You pass a blood clot from your vagina. °Get help right away if: °· You have chest pain. °· You have difficulty breathing. °· You have sudden, severe leg pain. °· You have severe pain or cramping in your lower abdomen. °· You bleed from your vagina so much that you fill more than one sanitary pad in one hour. Bleeding should not be heavier than your heaviest period. °· You develop a severe headache. °· You faint. °· You have blurred vision or spots in your vision. °· You have bad-smelling vaginal discharge. °· You have thoughts about hurting yourself or your baby. °If you ever feel like you may hurt yourself or others, or have thoughts about taking your own life, get help   right away. You can go to the nearest emergency department or call:  Your local emergency services (911 in the [place]  A suicide crisis helpline, such as the [facility] at 270-867-7421. This is open 24 hours a day. Summary  The period of time right after you deliver your newborn up to 6-12 weeks after delivery is called the postpartum period.  Gradually return to your normal activities as told by your health care provider.  Keep all follow-up visits for you and your baby as told by your health care provider. This information is not intended to replace advice given to you by your health care provider. Make sure you discuss any questions you have with your health care provider. Document Released: 11/07/2006 Document Revised: 10/24/2016 Document Reviewed: 10/24/2016 Elsevier Interactive Patient Education  2019 [employer]. Intrauterine Device Information An intrauterine device (IUD) is a medical device that is inserted in the uterus to  prevent pregnancy. It is a small, T-shaped device that has one or two nylon strings hanging down from it. The strings hang out of the lower part of the uterus (cervix) to allow for future IUD removal. There are two types of IUDs available:  Hormone IUD. This type of IUD is made of plastic and contains the hormone progestin (synthetic progesterone). A hormone IUD may last 3-5 years.  Copper IUD. This type of IUD has copper wire wrapped around it. A copper IUD may last up to 10 years. How is an IUD inserted? An IUD is inserted through the vagina and placed into the uterus with a minor medical procedure. The exact procedure for IUD insertion may vary among health care providers and hospitals. How does an IUD work? Synthetic progesterone in a hormonal IUD prevents pregnancy by:  Thickening cervical mucus to prevent sperm from entering the uterus.  Thinning the uterine lining to prevent a fertilized egg from being implanted there. Copper in a copper IUD prevents pregnancy by making the uterus and fallopian tubes produce a fluid that kills sperm. What are the advantages of an IUD? Advantages of either type of IUD  It is highly effective in preventing pregnancy.  It is reversible. You can become pregnant shortly after the IUD is removed.  It is low-maintenance and can stay in place for a long time.  There are no estrogen-related side effects.  It can be used when breastfeeding.  It is not associated with weight gain.  It can be inserted right after childbirth, an abortion, or a miscarriage. Advantages of a hormone IUD  If it is inserted within 7 days of your period starting, it works right after it is inserted. If the hormone IUD is inserted at any other time in your cycle, you will need to use a backup method of birth control for 7 days after insertion.  It can make menstrual periods lighter.  It can reduce menstrual cramping.  It can be used for 3-5 years. Advantages of a copper  IUD  It works right after it is inserted.  It can be used as a form of emergency birth control if it is inserted within 5 days after having unprotected sex.  It does not interfere with your body's natural hormones.  It can be used for 10 years. What are the disadvantages of an IUD?  An IUD may cause irregular menstrual bleeding for a period of time after insertion.  You may have pain during insertion and have cramping and vaginal bleeding after insertion.  An IUD may  cut the uterus (uterine perforation) when it is inserted. This is rare.  An IUD may cause pelvic inflammatory disease (PID), which is an infection in the uterus and fallopian tubes. This is rare, and it usually happens during the first 20 days after the IUD is inserted.  A copper IUD can make your menstrual flow heavier and more painful. How is an IUD removed?  You will lie on your back with your knees bent and your feet in footrests (stirrups).  A device will be inserted into your vagina to spread apart the vaginal walls (speculum). This will allow your health care provider to see the strings attached to the IUD.  Your health care provider will use a small instrument (forceps) to grasp the IUD strings and pull firmly until the IUD is removed. You may have some discomfort when the IUD is removed. Your health care provider may recommend taking over-the-counter pain relievers, such as ibuprofen, before the procedure. You may also have minor spotting for a few days after the procedure. The exact procedure for IUD removal may vary among health care providers and hospitals. Is the IUD right for me? Your health care provider will make sure you are a good candidate for an IUD and will discuss the advantages, disadvantages, and possible side effects with you. Summary  An intrauterine device (IUD) is a medical device that is inserted in the uterus to prevent pregnancy. It is a small, T-shaped device that has one or two nylon  strings hanging down from it.  A hormone IUD contains the hormone progestin (synthetic progesterone). A copper IUD has copper wire wrapped around it.  Synthetic progesterone in a hormone IUD prevents pregnancy by thickening cervical mucus and thinning the walls of the uterus. Copper in a copper IUD prevents pregnancy by making the uterus and fallopian tubes produce a fluid that kills sperm.  A hormone IUD can be left in place for 3-5 years. A copper IUD can be left in place for up to 10 years.  An IUD is inserted and removed by a health care provider. You may feel some pain during insertion and removal. Your health care provider may recommend taking over-the-counter pain medicine, such as ibuprofen, before an IUD procedure. This information is not intended to replace advice given to you by your health care provider. Make sure you discuss any questions you have with your health care provider. Document Released: 12/15/2003 Document Revised: 02/09/2016 Document Reviewed: 02/09/2016 Elsevier Interactive Patient Education  2019 [employer]

## 2018-07-06 MED ORDER — ACETAMINOPHEN 325 MG PO TABS: 650.0000 mg | ORAL_TABLET | ORAL | Status: DC | PRN

## 2018-07-06 MED ORDER — IBUPROFEN 600 MG PO TABS: 600.0000 mg | ORAL_TABLET | Freq: Four times a day (QID) | ORAL | 0 refills | Status: DC

## 2018-07-06 NOTE — Lactation Note (Signed)
This note was copied from a baby's chart. Lactation Consultation Note  Patient Name: Caitlin Rhodes [id number] Date: 07/06/2018 Reason for consult: Term;Infant weight loss(5% weight loss)  Baby is 10 hours old / term for D/C today.  Per mom the baby recently fed and breast are filling fuller and heavier today,  Have been supplementing a few times with formula.  LC recommended since moms milk is in , weight is good, and if baby wide  Awake and wanting to feed offer the 1st breast , if still hungry offer the 2nd breast ,  Hold off on the supplementing.  If there is a feeding where the baby had to be woken up to feed and seems sluggish  , give appetizer 1st 5-10 ml; and then feed. Feed for 15 -20 mins and the supplement afterwards 25 - 30 ml  Of EBM if available and formula 2nd.  Mom mentioned she is having some nipple soreness and has coconut oil.  LC instructed mom on the use shells between feedings except when sleeping, comfort gels after she feeds.  Per mom has a DEBP Medela at home and DEBP kit she used in the hospital.  LC stressed the importance of STS feedings until the baby can stay awake for feedings. Nutritive vs non - nutritive  Feeding patterns and the importance of watching the baby for hanging out latched.   mom and grandmother receptive to breastfeeding review, asked several breastfeeding questions, and LC answered  Them.  Both expressed appreciation for LC's visit.     Maternal Data Has patient been taught Hand Expression?: Yes  Feeding Feeding Type: (per mom baby recently fed)  LATCH Score                   Interventions Interventions: Breast feeding basics reviewed;Shells;Coconut oil;Comfort gels;DEBP  Lactation Tools Discussed/Used Tools: Shells;Pump;Flanges;Coconut oil;Comfort gels Flange Size: 24;27 Shell Type: Inverted Breast pump type: Double-Electric Breast Pump Pump Review: Setup, frequency, and cleaning;Milk Storage(reviewed for  D/C)   Consult Status Consult Status: Complete Date: 07/06/18    Myer Haff 07/06/2018, 11:29 AM

## 2018-07-25 ENCOUNTER — Telehealth: Payer: Self-pay
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
## 2018-08-06 ENCOUNTER — Ambulatory Visit: Payer: BC Managed Care – PPO | Admitting: Obstetrics and Gynecology

## 2018-08-24 ENCOUNTER — Ambulatory Visit: Payer: BC Managed Care – PPO | Admitting: Family Medicine

## 2018-09-07 ENCOUNTER — Ambulatory Visit: Payer: BC Managed Care – PPO | Admitting: Nurse Practitioner

## 2018-09-18 ENCOUNTER — Telehealth: Payer: Self-pay | Admitting: Nurse Practitioner

## 2018-11-09 ENCOUNTER — Ambulatory Visit: Payer: BC Managed Care – PPO | Admitting: Obstetrics

## 2018-11-16 ENCOUNTER — Ambulatory Visit (INDEPENDENT_AMBULATORY_CARE_PROVIDER_SITE_OTHER): Payer: BC Managed Care – PPO | Admitting: Obstetrics

## 2018-11-16 ENCOUNTER — Encounter: Payer: Self-pay | Admitting: Obstetrics

## 2018-11-16 ENCOUNTER — Other Ambulatory Visit: Payer: Self-pay

## 2018-11-16 DIAGNOSIS — F53 Postpartum depression: Secondary | ICD-10-CM | POA: Diagnosis not present

## 2018-11-16 DIAGNOSIS — O99345 Other mental disorders complicating the puerperium: Secondary | ICD-10-CM

## 2018-11-16 DIAGNOSIS — N944 Primary dysmenorrhea: Secondary | ICD-10-CM | POA: Diagnosis not present

## 2018-11-16 MED ORDER — SERTRALINE HCL 50 MG PO TABS: 50.0000 mg | ORAL_TABLET | Freq: Every day | ORAL | 5 refills | Status: DC

## 2018-11-16 MED ORDER — IBUPROFEN 800 MG PO TABS: 800.0000 mg | ORAL_TABLET | Freq: Three times a day (TID) | ORAL | 5 refills | Status: DC | PRN

## 2018-11-16 MED ORDER — PRENATAL GUMMIES/DHA & FA 0.4-32.5 MG PO CHEW: 1.0000 | CHEWABLE_TABLET | Freq: Every day | ORAL | 5 refills | Status: DC

## 2018-11-16 NOTE — Patient Instructions (Signed)
Dysmenorrhea Dysmenorrhea means painful cramps during your period (menstrual period). You will have pain in your lower belly (abdomen). The pain is caused by the tightening (contracting) of the muscles of the womb (uterus). The pain may be mild or very bad. With this condition, you may:  Have a headache.  Feel sick to your stomach (nauseous).  Throw up (vomit).  Have lower back pain. Follow these instructions at home: Helping pain and cramping   Put heat on your lower back or belly when you have pain or cramps. Use the heat source that your doctor tells you to use. ? Place a towel between your skin and the heat. ? Leave the heat on for 20-30 minutes. ? Remove the heat if your skin turns bright red. This is especially important if you cannot feel pain, heat, or cold. ? Do not have a heating pad on during sleep.  Do aerobic exercises. These include walking, swimming, or biking. These may help with cramps.  Massage your lower back or belly. This may help lessen pain. General instructions  Take over-the-counter and prescription medicines only as told by your doctor.  Do not drive or use heavy machinery while taking prescription pain medicine.  Avoid alcohol and caffeine during and right before your period. These can make cramps worse.  Do not use any products that have nicotine or tobacco. These include cigarettes and e-cigarettes. If you need help quitting, ask your doctor.  Keep all follow-up visits as told by your doctor. This is important. Contact a doctor if:  You have pain that gets worse.  You have pain that does not get better with medicine.  You have pain during sex.  You feel sick to your stomach or you throw up during your period, and medicine does not help. Get help right away if:  You pass out (faint). Summary  Dysmenorrhea means painful cramps during your period (menstrual period).  Put heat on your lower back or belly when you have pain or cramps.  Do  exercises like walking, swimming, or biking to help with cramps.  Contact a doctor if you have pain during sex. This information is not intended to replace advice given to you by your health care provider. Make sure you discuss any questions you have with your health care provider. Document Released: 04/08/2008 Document Revised: 12/23/2016 Document Reviewed: 01/28/2016 Elsevier Patient Education  2020 [employer]  Perinatal Depression When a woman feels excessive sadness, anger, or anxiety during pregnancy or during the first 12 months after she gives birth, she has a condition called perinatal depression. Depression can interfere with work, school, relationships, and other everyday activities. If it is not managed properly, it can also cause problems in the mother and her baby. Sometimes, perinatal depression is left untreated because symptoms are thought to be normal mood swings during and right after pregnancy. If you have symptoms of depression, it is important to talk with your health care provider. What are the causes? The exact cause of this condition is not known. Hormonal changes during and after pregnancy may play a role in causing perinatal depression. What increases the risk? You are more likely to develop this condition if:  You have a personal or family history of depression, anxiety, or mood disorders.  You experience a stressful life event during pregnancy, such as the death of a loved one.  You have a lot of regular life stress.  You do not have support from family members or loved ones, or you  are in an abusive relationship. What are the signs or symptoms? Symptoms of this condition include:  Feeling sad or hopeless.  Feelings of guilt.  Feeling irritable or overwhelmed.  Changes in your appetite.  Lack of energy or motivation.  Sleep problems.  Difficulty concentrating or completing tasks.  Loss of interest in hobbies or relationships.  Headaches or  stomach problems that do not go away. How is this diagnosed? This condition is diagnosed based on a physical exam and mental evaluation. In some cases, your health care provider may use a depression screening tool. These tools include a list of questions that can help a health care provider diagnose depression. Your health care provider may refer you to a mental health expert who specializes in depression. How is this treated? This condition may be treated with:  Medicines. Your health care provider will only give you medicines that have been proven safe for pregnancy and breastfeeding.  Talk therapy with a mental health professional to help change your patterns of thinking (cognitive behavioral therapy).  Support groups.  Brain stimulation or light therapies.  Stress reduction therapies, such as mindfulness. Follow these instructions at home: Lifestyle  Do not use any products that contain nicotine or tobacco, such as cigarettes and e-cigarettes. If you need help quitting, ask your health care provider.  Do not use alcohol when you are pregnant. After your baby is born, limit alcohol intake to no more than 1 drink a day. One drink equals 12 oz of beer, 5 oz of wine, or 1 oz of hard liquor.  Consider joining a support group for new mothers. Ask your health care provider for recommendations.  Take good care of yourself. Make sure you: ? Get plenty of sleep. If you are having trouble sleeping, talk with your health care provider. ? Eat a healthy diet. This includes plenty of fruits and vegetables, whole grains, and lean proteins. ? Exercise regularly, as told by your health care provider. Ask your health care provider what exercises are safe for you. General instructions  Take over-the-counter and prescription medicines only as told by your health care provider.  Talk with your partner or family members about your feelings during pregnancy. Share any concerns or anxieties that you may  have.  Ask for help with tasks or chores when you need it. Ask friends and family members to provide meals, watch your children, or help with cleaning.  Keep all follow-up visits as told by your health care provider. This is important. Contact a health care provider if:  You (or people close to you) notice that you have any symptoms of depression.  You have depression and your symptoms get worse.  You experience side effects from medicines, such as nausea or sleep problems. Get help right away if:  You feel like hurting yourself, your baby, or someone else. If you ever feel like you may hurt yourself or others, or have thoughts about taking your own life, get help right away. You can go to your nearest emergency department or call:  Your local emergency services (911 in the [place]  A suicide crisis helpline, such as the National Suicide Prevention Lifeline at (240)608-2088. This is open 24 hours a day. Summary  Perinatal depression is when a woman feels excessive sadness, anger, or anxiety during pregnancy or during the first 12 months after she gives birth.  If perinatal depression is not treated, it can lead to health problems for the mother and her baby.  This condition is  treated with medicines, talk therapy, stress reduction therapies, or a combination of two or more treatments.  Talk with your partner or family members about your feelings. Do not be afraid to ask for help. This information is not intended to replace advice given to you by your health care provider. Make sure you discuss any questions you have with your health care provider. Document Released: 03/09/2016 Document Revised: 01/13/2017 Document Reviewed: 03/09/2016 Elsevier Patient Education  2020 [employer]  Postpartum Baby Blues The postpartum period begins right after the birth of a baby. During this time, there is often a lot of joy and excitement. It is also a time of many changes in the life of the  parents. No matter how many times a mother gives birth, each child brings new challenges to the family, including different ways of relating to one another. It is common to have feelings of excitement along with confusing changes in moods, emotions, and thoughts. You may feel happy one minute and sad or stressed the next. These feelings of sadness usually happen in the period right after you have your baby, and they go away within a week or two. This is called the "baby blues." What are the causes? There is no known cause of baby blues. It is likely caused by a combination of factors. However, changes in hormone levels after childbirth are believed to trigger some of the symptoms. Other factors that can play a role in these mood changes include:  Lack of sleep.  Stressful life events, such as poverty, caring for a loved one, or death of a loved one.  Genetics. What are the signs or symptoms? Symptoms of this condition include:  Brief changes in mood, such as going from extreme happiness to sadness.  Decreased concentration.  Difficulty sleeping.  Crying spells and tearfulness.  Loss of appetite.  Irritability.  Anxiety. If the symptoms of baby blues last for more than 2 weeks or become more severe, you may have postpartum depression. How is this diagnosed? This condition is diagnosed based on an evaluation of your symptoms. There are no medical or lab tests that lead to a diagnosis, but there are various questionnaires that a health care provider may use to identify women with the baby blues or postpartum depression. How is this treated? Treatment is not needed for this condition. The baby blues usually go away on their own in 1-2 weeks. Social support is often all that is needed. You will be encouraged to get adequate sleep and rest. Follow these instructions at home: Lifestyle      Get as much rest as you can. Take a nap when the baby sleeps.  Exercise regularly as told by  your health care provider. Some women find yoga and walking to be helpful.  Eat a balanced and nourishing diet. This includes plenty of fruits and vegetables, whole grains, and lean proteins.  Do little things that you enjoy. Have a cup of tea, take a bubble bath, read your favorite magazine, or listen to your favorite music.  Avoid alcohol.  Ask for help with household chores, cooking, grocery shopping, or running errands. Do not try to do everything yourself. Consider hiring a postpartum doula to help. This is a professional who specializes in providing support to new mothers.  Try not to make any major life changes during pregnancy or right after giving birth. This can add stress. General instructions  Talk to people close to you about how you are feeling. Get support  from your partner, family members, friends, or other new moms. You may want to join a support group.  Find ways to cope with stress. This may include: ? Writing your thoughts and feelings in a journal. ? Spending time outside. ? Spending time with people who make you laugh.  Try to stay positive in how you think. Think about the things you are grateful for.  Take over-the-counter and prescription medicines only as told by your health care provider.  Let your health care provider know if you have any concerns.  Keep all postpartum visits as told by your health care provider. This is important. Contact a health care provider if:  Your baby blues do not go away after 2 weeks. Get help right away if:  You have thoughts of taking your own life (suicidal thoughts).  You think you may harm the baby or other people.  You see or hear things that are not there (hallucinations). Summary  After giving birth, you may feel happy one minute and sad or stressed the next. Feelings of sadness that happen right after the baby is born and go away after a week or two are called the "baby blues."  You can manage the baby blues by  getting enough rest, eating a healthy diet, exercising, spending time with supportive people, and finding ways to cope with stress.  If feelings of sadness and stress last longer than 2 weeks or get in the way of caring for your baby, talk to your health care provider. This may mean you have postpartum depression. This information is not intended to replace advice given to you by your health care provider. Make sure you discuss any questions you have with your health care provider. Document Released: 10/15/2003 Document Revised: 05/04/2018 Document Reviewed: 03/08/2016 Elsevier Patient Education  2020 [employer].

## 2018-11-16 NOTE — Progress Notes (Signed)
Patient ID: Caitlin Rhodes, female   DOB: 1990-12-28, 28 y.o.   MRN: [id number]  Chief Complaint  Patient presents with  . Depression    feeling depressed since delivery    HPI Caitlin Rhodes is a 28 y.o. female.  Presents for follow up for postpartum depression.  She is s/p NSVD in June 2020.  Patient receiving counseling from [facility], but not feeling much improvement.  She is on no meds. HPI  Past Medical History:  Diagnosis Date  . Seizures ([place])     Past Surgical History:  Procedure Laterality Date  . NO PAST SURGERIES      Family History  Problem Relation Age of Onset  . Cancer Maternal Grandmother   . Cancer Paternal Grandmother     Social History Social History   Tobacco Use  . Smoking status: Never Smoker  . Smokeless tobacco: Never Used  Substance Use Topics  . Alcohol use: Not Currently  . Drug use: No    No Known Allergies  Current Outpatient Medications  Medication Sig Dispense Refill  . acetaminophen (TYLENOL) 325 MG tablet Take 2 tablets (650 mg total) by mouth every 4 (four) hours as needed (for pain scale < 4). (Patient not taking: Reported on 11/16/2018)    . ibuprofen (ADVIL) 600 MG tablet Take 1 tablet (600 mg total) by mouth every 6 (six) hours. (Patient not taking: Reported on 11/16/2018) 30 tablet 0  . ibuprofen (ADVIL) 800 MG tablet Take 1 tablet (800 mg total) by mouth every 8 (eight) hours as needed. 30 tablet 5  . Prenatal MV-Min-FA-Omega-3 (PRENATAL GUMMIES/DHA & FA) 0.4-32.5 MG CHEW Chew 1 tablet by mouth daily before breakfast. 30 tablet 5  . Probiotic Product (PROBIOTIC PO) Take by mouth.    . sertraline (ZOLOFT) 50 MG tablet Take 1 tablet (50 mg total) by mouth daily. 30 tablet 5   No current facility-administered medications for this visit.     Review of Systems Review of Systems Constitutional: negative for fatigue and weight loss Respiratory: negative for cough and wheezing Cardiovascular: negative for chest  pain, fatigue and palpitations Gastrointestinal: negative for abdominal pain and change in bowel habits Genitourinary:negative Integument/breast: negative for nipple discharge Musculoskeletal:negative for myalgias Neurological: negative for gait problems and tremors Behavioral/Psych: negative for abusive relationship, depression Endocrine: negative for temperature intolerance      Blood pressure 121/83, pulse 69, weight 150 lb (68 kg), unknown if currently breastfeeding.  Physical Exam Physical Exam:        General:  Alert and no distress      Respiratory:  Good respiratory effort.  No breathing problems      Mental:  Stable mood and affect.  Good judgement.   >50 % of 15 min visit spent on counseling and coordination of care.   Data Reviewed Labs  Assessment     1. Postpartum care following vaginal delivery Rx: - Prenatal MV-Min-FA-Omega-3 (PRENATAL GUMMIES/DHA & FA) 0.4-32.5 MG CHEW; Chew 1 tablet by mouth daily before breakfast.  Dispense: 30 tablet; Refill: 5  2. Postpartum depression Rx: - sertraline (ZOLOFT) 50 MG tablet; Take 1 tablet (50 mg total) by mouth daily.  Dispense: 30 tablet; Refill: 5  3. Primary dysmenorrhea Rx: - ibuprofen (ADVIL) 800 MG tablet; Take 1 tablet (800 mg total) by mouth every 8 (eight) hours as needed.  Dispense: 30 tablet; Refill: 5    Plan    Follow up in 6 weeks   Meds ordered this encounter  Medications  .  sertraline (ZOLOFT) 50 MG tablet    Sig: Take 1 tablet (50 mg total) by mouth daily.    Dispense:  30 tablet    Refill:  5  . ibuprofen (ADVIL) 800 MG tablet    Sig: Take 1 tablet (800 mg total) by mouth every 8 (eight) hours as needed.    Dispense:  30 tablet    Refill:  5  . Prenatal MV-Min-FA-Omega-3 (PRENATAL GUMMIES/DHA & FA) 0.4-32.5 MG CHEW    Sig: Chew 1 tablet by mouth daily before breakfast.    Dispense:  30 tablet    Refill:  5    Brock Bad, MD 11/16/2018 9:24 AM

## 2018-12-28 ENCOUNTER — Ambulatory Visit: Payer: BC Managed Care – PPO | Admitting: Obstetrics

## 2019-02-18 ENCOUNTER — Other Ambulatory Visit: Payer: Self-pay

## 2019-02-18 ENCOUNTER — Ambulatory Visit (HOSPITAL_COMMUNITY)
Admission: EM | Admit: 2019-02-18 | Discharge: 2019-02-18 | Disposition: A | Discharge status: Home / Self Care | Payer: Medicaid Other | Attending: Nurse Practitioner | Admitting: Nurse Practitioner

## 2019-02-18 ENCOUNTER — Encounter (HOSPITAL_COMMUNITY): Payer: Self-pay

## 2019-02-18 DIAGNOSIS — S161XXA Strain of muscle, fascia and tendon at neck level, initial encounter: Secondary | ICD-10-CM

## 2019-02-18 MED ORDER — IBUPROFEN 800 MG PO TABS: 800.0000 mg | ORAL_TABLET | Freq: Three times a day (TID) | ORAL | 0 refills | Status: DC

## 2019-02-18 MED ORDER — CYCLOBENZAPRINE HCL 10 MG PO TABS: 10.0000 mg | ORAL_TABLET | Freq: Two times a day (BID) | ORAL | 0 refills | Status: DC | PRN

## 2019-02-18 NOTE — Discharge Instructions (Signed)
Take medications as prescribed Alternate between ice and heat to affected areas   Feel better soon!  Lelon Mast, FNP-C
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
## 2019-04-22 ENCOUNTER — Other Ambulatory Visit: Payer: Self-pay

## 2019-04-22 MED ORDER — FLUCONAZOLE 150 MG PO TABS: 150.0000 mg | ORAL_TABLET | Freq: Once | ORAL | 0 refills | Status: AC

## 2019-04-22 MED ORDER — TERCONAZOLE 0.8 % VA CREA: 1.0000 | TOPICAL_CREAM | Freq: Every day | VAGINAL | 0 refills | Status: DC

## 2019-04-22 NOTE — Progress Notes (Signed)
Diflucan sent for yeast infection symptoms, pt made aware.

## 2019-05-08 ENCOUNTER — Ambulatory Visit: Payer: Medicaid Other

## 2019-09-24 ENCOUNTER — Other Ambulatory Visit: Payer: Self-pay | Admitting: *Deleted

## 2019-09-24 DIAGNOSIS — N760 Acute vaginitis: Secondary | ICD-10-CM

## 2019-09-24 DIAGNOSIS — N898 Other specified noninflammatory disorders of vagina: Secondary | ICD-10-CM

## 2019-09-24 MED ORDER — METRONIDAZOLE 500 MG PO TABS: 500.0000 mg | ORAL_TABLET | Freq: Two times a day (BID) | ORAL | 0 refills | Status: AC

## 2019-09-24 NOTE — Progress Notes (Signed)
Flagyl sent today per protocol.  See pt email encounters

## 2019-11-25 ENCOUNTER — Other Ambulatory Visit: Payer: Self-pay

## 2019-11-25 ENCOUNTER — Encounter (HOSPITAL_COMMUNITY): Payer: Self-pay

## 2019-11-25 ENCOUNTER — Ambulatory Visit (HOSPITAL_COMMUNITY)
Admission: EM | Admit: 2019-11-25 | Discharge: 2019-11-25 | Disposition: A | Discharge status: Home / Self Care | Payer: Medicaid Other | Attending: Family Medicine | Admitting: Family Medicine

## 2019-11-25 DIAGNOSIS — T7840XA Allergy, unspecified, initial encounter: Secondary | ICD-10-CM

## 2019-11-25 MED ORDER — PREDNISONE 20 MG PO TABS: 20.0000 mg | ORAL_TABLET | Freq: Two times a day (BID) | ORAL | 0 refills | Status: DC

## 2019-11-25 NOTE — Discharge Instructions (Addendum)
Take prednisone 2 times a day for 5 days Take 2 doses today Use cool compresses to help with discomfort May use a liquid tear eyedrop If irritation does not resolve, may need to remove the lashes

## 2019-11-25 NOTE — ED Triage Notes (Signed)
Pt in with c/o possible allergic reaction after having her eye lashes done Saturday. States that it was burning initially and now eyes are now red and irritated.  States that vision is kind of blurry  Pt has been using eye drops with minimal relief  Denies sob

## 2019-11-25 NOTE — ED Provider Notes (Signed)
[facility]    CSN: [id number] Arrival date & time: 11/25/19  1036      History   Chief Complaint Chief Complaint  Patient presents with  . Allergic Reaction    HPI Caitlin Rhodes is a 29 y.o. female.   HPI  Patient is here for eye irritation.  She states it started today after she had both eyelashes placed.  She states that she went to a new salon where she had not been before.  Her eyes are red.  Feels a little "blurry".  Feels irritated.  No photophobia.  No drainage or discharge from the eyes.  She has slight swelling of her upper lids.  Past Medical History:  Diagnosis Date  . Seizures [facility])     Patient Active Problem List   Diagnosis Date Noted  . Indication for care or intervention in labor or delivery 07/04/2018  . SVD (spontaneous vaginal delivery) 07/04/2018  . Fall 06/15/2018  . Unwanted fertility 06/13/2018  . Abnormal fetal ultrasound 03/22/2018  . Supervision of normal pregnancy, antepartum 02/22/2018  . Seizure disorder ([facility]) 09/01/2012    Past Surgical History:  Procedure Laterality Date  . NO PAST SURGERIES      OB History    Gravida  5   Para  4   Term  4   Preterm  0   AB  1   Living  4     SAB  1   TAB  0   Ectopic  0   Multiple  0   Live Births  4            Home Medications    Prior to Admission medications   Medication Sig Start Date End Date Taking? Authorizing Provider  ibuprofen (ADVIL) 800 MG tablet Take 1 tablet (800 mg total) by mouth 3 (three) times daily. Take 1 tablet three times a day with food x 5 days then may take as needed 02/18/19   Lurline Idol, FNP  predniSONE (DELTASONE) 20 MG tablet Take 1 tablet (20 mg total) by mouth 2 (two) times daily with a meal. 11/25/19   Eustace Moore, MD  Prenatal MV-Min-FA-Omega-3 (PRENATAL GUMMIES/DHA & FA) 0.4-32.5 MG CHEW Chew 1 tablet by mouth daily before breakfast. 11/16/18   Brock Bad, MD  Probiotic Product (PROBIOTIC PO) Take  by mouth.    [provider]  sertraline (ZOLOFT) 50 MG tablet Take 1 tablet (50 mg total) by mouth daily. 11/16/18   Brock Bad, MD    Family History Family History  Problem Relation Age of Onset  . Cancer Maternal Grandmother   . Cancer Paternal Grandmother     Social History Social History   Tobacco Use  . Smoking status: Never Smoker  . Smokeless tobacco: Never Used  Vaping Use  . Vaping Use: Never used  Substance Use Topics  . Alcohol use: Not Currently  . Drug use: No     Allergies   Patient has no known allergies.   Review of Systems Review of Systems  See HPI Physical Exam Triage Vital Signs ED Triage Vitals  Enc Vitals Group     BP 11/25/19 1215 122/76     Pulse Rate 11/25/19 1215 75     Resp 11/25/19 1215 19     Temp --      Temp src --      SpO2 11/25/19 1215 100 %     Weight --  Height --      Head Circumference --      Peak Flow --      Pain Score 11/25/19 1212 9     Pain Loc --      Pain Edu? --      Excl. in GC? --    No data found.  Updated Vital Signs BP 122/76 (BP Location: Right Wrist)   Pulse 75   Resp 19   LMP 11/11/2019 (Approximate)   SpO2 100%   Breastfeeding No   Visual Acuity Right Eye Distance: 20/40 Left Eye Distance: 20/40 Bilateral Distance: 20/25      Physical Exam Constitutional:      General: She is not in acute distress.    Appearance: She is well-developed.  HENT:     Head: Normocephalic and atraumatic.     Mouth/Throat:     Comments: Mask in place Eyes:     Conjunctiva/sclera: Conjunctivae normal.     Pupils: Pupils are equal, round, and reactive to light.     Comments: Mild conjunctival injection.  Upper lids with some soft tissue swelling.  Mild erythema.  No rash or fasciculation  Cardiovascular:     Rate and Rhythm: Normal rate.  Pulmonary:     Effort: Pulmonary effort is normal. No respiratory distress.  Abdominal:     General: There is no distension.     Palpations:  Abdomen is soft.  Musculoskeletal:        General: Normal range of motion.     Cervical back: Normal range of motion.  Skin:    General: Skin is warm and dry.  Neurological:     Mental Status: She is alert.  Psychiatric:        Behavior: Behavior normal.      UC Treatments / Results  Labs (all labs ordered are listed, but only abnormal results are displayed) Labs Reviewed - No data to display  EKG   Radiology No results found.  Procedures Procedures (including critical care time)  Medications Ordered in UC Medications - No data to display  Initial Impression / Assessment and Plan / UC Course  I have reviewed the triage vital signs and the nursing notes.  Pertinent labs & imaging results that were available during my care of the patient were reviewed by me and considered in my medical decision making (see chart for details).     Reviewed with patient that she likely has an allergic reaction to the adhesive.  Recommend avoidance Final Clinical Impressions(s) / UC Diagnoses   Final diagnoses:  Allergic reaction, initial encounter     Discharge Instructions     Take prednisone 2 times a day for 5 days Take 2 doses today Use cool compresses to help with discomfort May use a liquid tear eyedrop If irritation does not resolve, may need to remove the lashes   ED Prescriptions    Medication Sig Dispense Auth. Provider   predniSONE (DELTASONE) 20 MG tablet Take 1 tablet (20 mg total) by mouth 2 (two) times daily with a meal. 10 tablet Eustace Moore, MD     PDMP not reviewed this encounter.   Eustace Moore, MD 11/25/19 [phone]

## 2019-12-03 ENCOUNTER — Ambulatory Visit: Payer: Medicaid Other | Admitting: Obstetrics

## 2019-12-12 ENCOUNTER — Ambulatory Visit: Payer: Medicaid Other | Admitting: Obstetrics

## 2020-01-12 ENCOUNTER — Ambulatory Visit (HOSPITAL_COMMUNITY): Payer: Self-pay

## 2020-03-03 ENCOUNTER — Ambulatory Visit: Payer: Medicaid Other

## 2020-03-29 IMAGING — US US MFM OB DETAIL+14 WK
1 series · 12 of 28 positions shown · non-contrast
Comparison: none

[Series 1: us mfm ob detail+14 wk · 85 acquisitions, 12 frames shown]
[im 4/85]
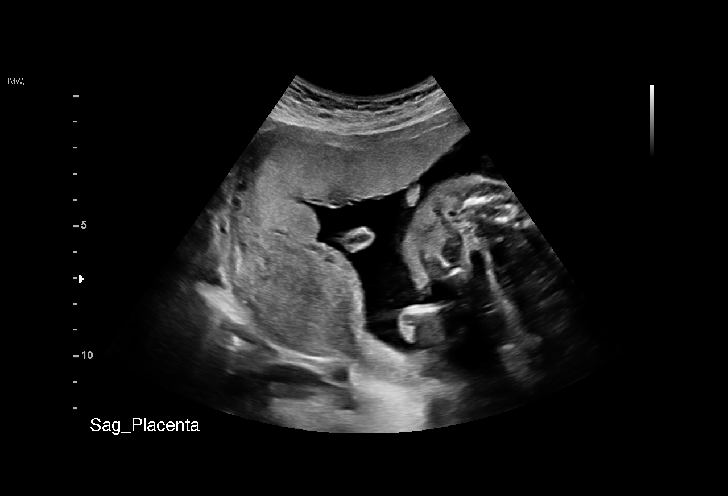
[im 10/85]
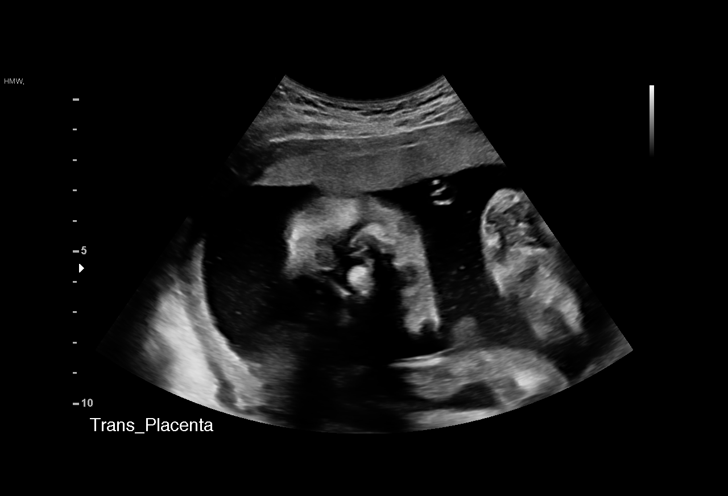
[im 16/85]
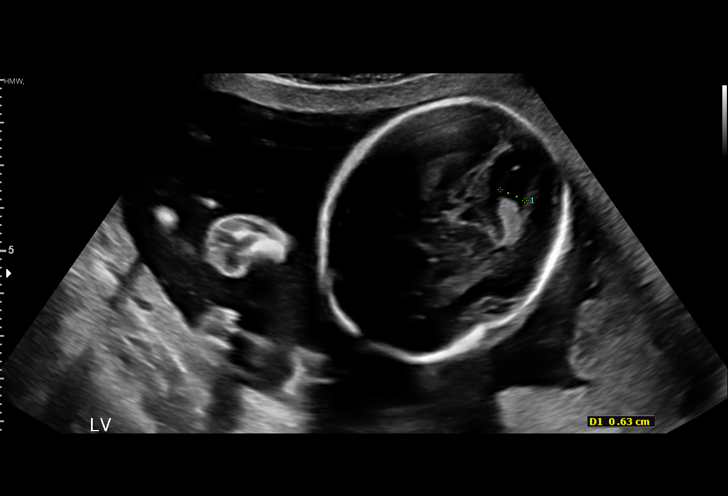
[im 25/85]
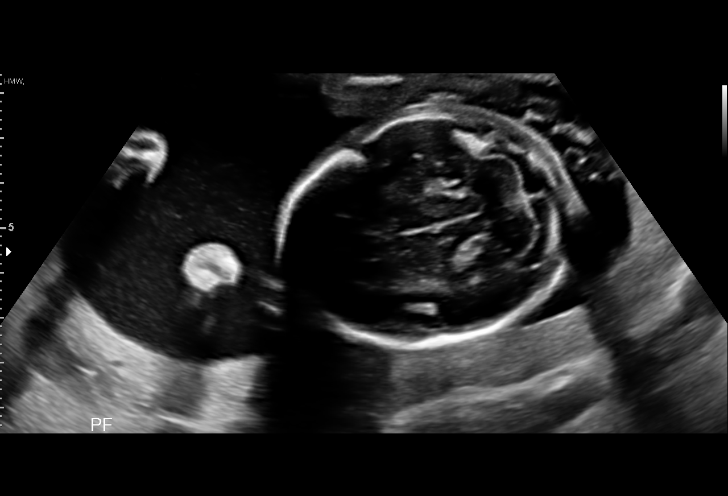
[im 32/85]
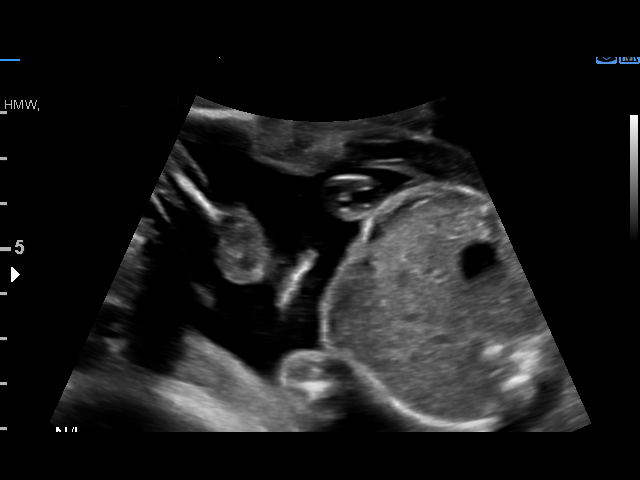
[im 38/85]
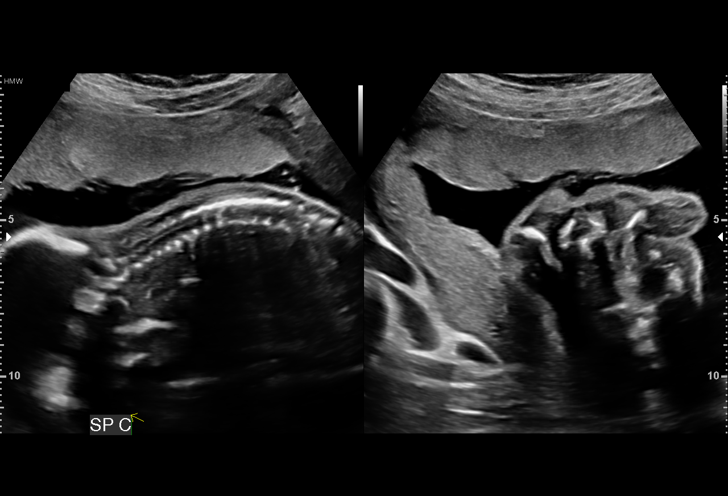
[im 47/85]
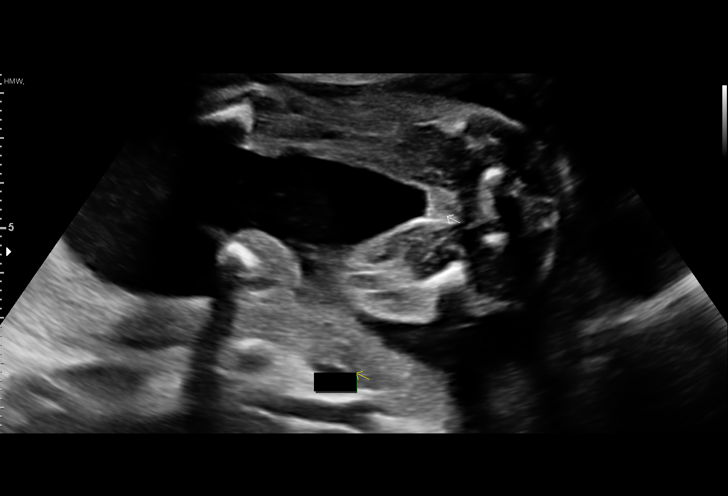
[im 53/85]
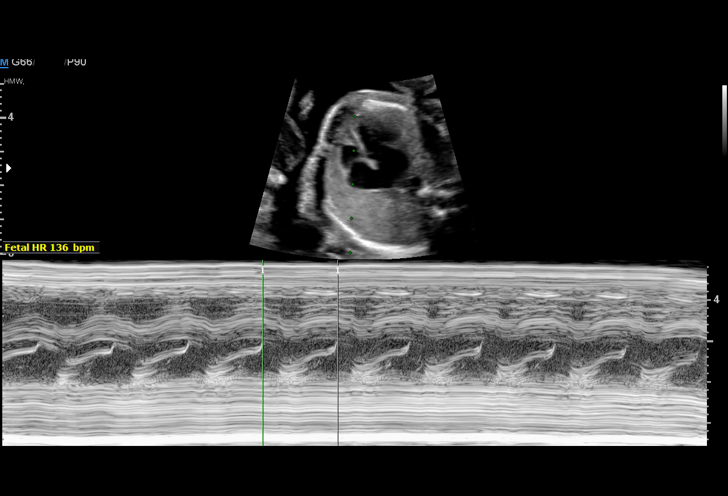
[im 60/85]
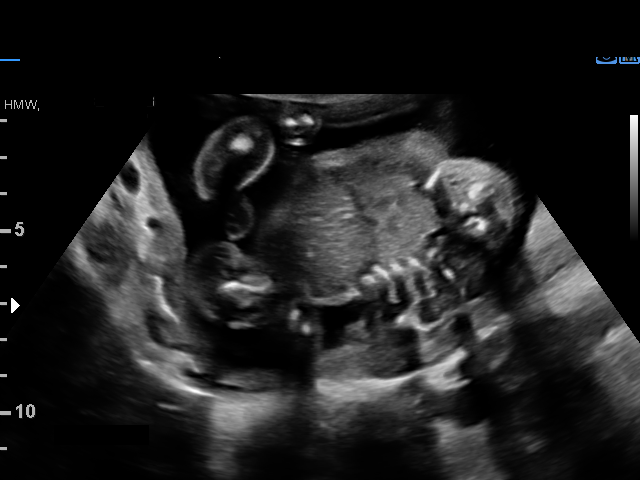
[im 69/85]
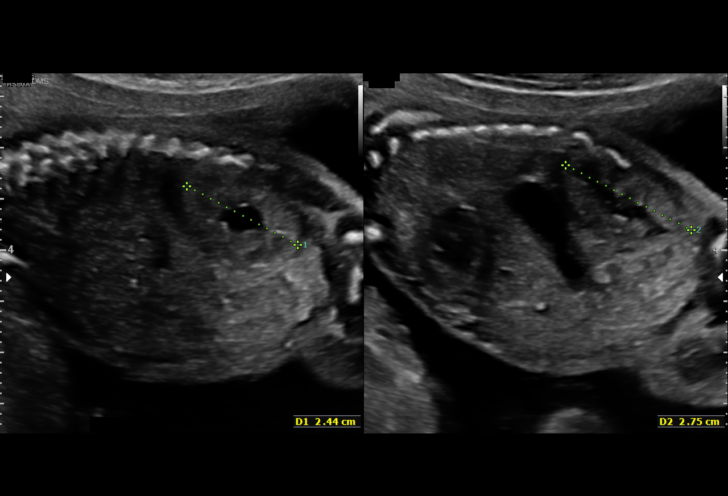
[im 75/85]
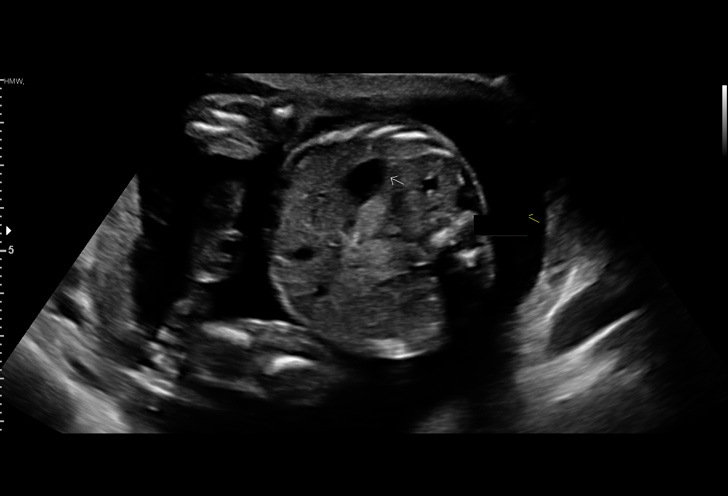
[im 81/85]
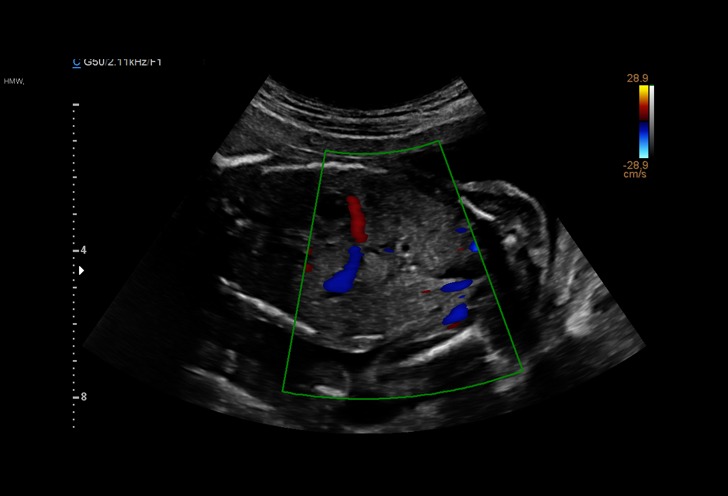

[12 of 28 positions shown; findings below may reference images not displayed]

[REDACTED]
                   [REDACTED]care - [HOSPITAL]

 ----------------------------------------------------------------------

 ----------------------------------------------------------------------
Indications

  22 weeks gestation of pregnancy
  Seizure disorder (no meds, no seizure since
  0122)
  Encounter for antenatal screening for
  malformations (1st trimester screen, [place])
  Echogenic bowel
  Pyelectasis of fetus on prenatal ultrasound
 ----------------------------------------------------------------------
Vital Signs

 BMI:
Fetal Evaluation

 Num Of Fetuses:         1
 Fetal Heart Rate(bpm):  136
 Cardiac Activity:       Observed
 Presentation:           Breech
 Placenta:               Fundal
 P. Cord Insertion:      Visualized, central

 Amniotic Fluid
 AFI FV:      Within normal limits

                             Largest Pocket(cm)

Biometry

 BPD:      50.6  mm     G. Age:  21w 2d         22  %    CI:        71.34   %    70 - 86
                                                         FL/HC:      19.3   %    18.4 -
 HC:      190.8  mm     G. Age:  21w 3d         16  %    HC/AC:      1.10        1.06 -
 AC:      174.2  mm     G. Age:  22w 2d         54  %    FL/BPD:     72.9   %    71 - 87
 FL:       36.9  mm     G. Age:  21w 5d         31  %    FL/AC:      21.2   %    20 - 24
 HUM:      34.5  mm     G. Age:  21w 6d         44  %

 Est. FW:     466  gm           1 lb     45  %
OB History

 Gravidity:    5         Term:   3        Prem:   0        SAB:   1
 TOP:          0       Ectopic:  0        Living: 3
Gestational Age

 LMP:           22w 6d        Date:  09/27/17                 EDD:   07/04/18
 U/S Today:     21w 5d                                        EDD:   07/12/18
 Best:          22w 0d     Det. By:  Early Ultrasound         EDD:   07/10/18
                                     (12/04/17)
Anatomy

 Cranium:               Appears normal         Aortic Arch:            Appears normal
 Cavum:                 Appears normal         Ductal Arch:            Not well visualized
 Ventricles:            Appears normal         Diaphragm:              Appears normal
 Choroid Plexus:        Appears normal         Stomach:                Appears normal, left
                                                                       sided
 Cerebellum:            Appears normal         Abdomen:                Echogenic Bowel
 Posterior Fossa:       Appears normal         Abdominal Wall:         Appears nml (cord
                                                                       insert, abd wall)
 Nuchal Fold:           Not applicable (>20    Cord Vessels:           Appears normal (3
                        wks GA)                                        vessel cord)
 Face:                  Appears normal         Kidneys:                Right sided
                        (orbits and profile)
                                                                       pyelectasis, 5 mm
 Lips:                  Appears normal         Bladder:                Appears normal
 Thoracic:              Appears normal         Spine:                  Appears normal
 Heart:                 Previously seen        Upper Extremities:      Appears normal
 RVOT:                  Appears normal         Lower Extremities:      Appears normal
 LVOT:                  Not well visualized

 Other:  Fetus appears to be female. Heels and Lt 5th digit visualized.
         Technically difficult due to fetal position.
Cervix Uterus Adnexa

 Cervix
 Length:            3.8  cm.
 Normal appearance by transabdominal scan.

 Uterus
 No abnormality visualized.

 Left Ovary
 No adnexal mass visualized.

 Right Ovary
 No adnexal mass visualized.
 Cul De Sac
 No free fluid seen.

 Adnexa
 No abnormality visualized.
Impression

 Ms. JAYLON3 P3, is here for fetal anatomy scan. On
 first-trimester screening, the risk of Down syndrome was not
 increased. She has history of seizure disorder, but the patient
 did not have seizures more than 9 years. She is not taking
 any anticonvulsants.

 We performed fetal anatomical survey. Fetal biometry is
 consistent with her previously established dates. Amniotic
 fluid is normal and good fetal activity is seen.
 Following findings are seen:
 -Hyperechogenic bowel.
 -Mild right urinary tract dilation (5 millimeters). Both kidneys
 look normal with no hyperechogenicities.

 No other markers of aneuploidies or fetal structural defects
 are seen.

 Patient did not have vaginal bleeding in this pregnancy. She
 also does not give history of fever or rashes.

 I explained the findings and the possible causes of
 hyperechogenic bowel. I do not suspect infectious cause
 (toxo or CMV) as they are very unlikely.
 I reassured the patient that urinary tract dilation usually
 resolves with advancing gestation or after birth. Rarely, they
 can be associated with obstructive uropathy. Early delivery is
 not indicated.

 I informed the patient that both are markers of Down
 syndrome. I discussed the significance and limitations of first-
 trimester screening.

 I discussed the following options: 1) Maternal blood cell-free
 fetal DNA screening  for trisomies 21, 18 and 13, which has a
 greater detection rate than conventional screening tests. I
 informed the patient that not all chromosomal malformations
 are detected by this test. 2) I informed her that only
 amniocentesis will give a definitive result on the fetal
 karyotype. I discussed a procedure-related pregnancy loss of
 about 1 in 400.

 Patient opted to have cell-free fetal DNA screening. Blood
 was drawn for cell-free fetal DNA screening ([facility]). We
 will communicate the results to the patient and fax a copy to
 your office.
Recommendations

 -An appointment was made for her to return in 4 weeks for
 completion of fetal anatomy.
                 Duca, [place]

## 2020-04-27 IMAGING — US US MFM OB FOLLOW UP
1 series · 13 of 28 positions shown · non-contrast
Comparison: none

[Series 1: us mfm ob follow up · 42 acquisitions, 13 frames shown]
[im 2/42]
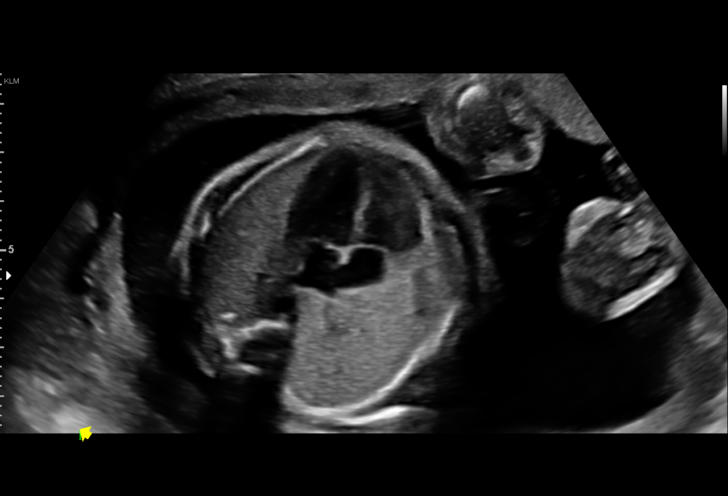
[im 5/42]
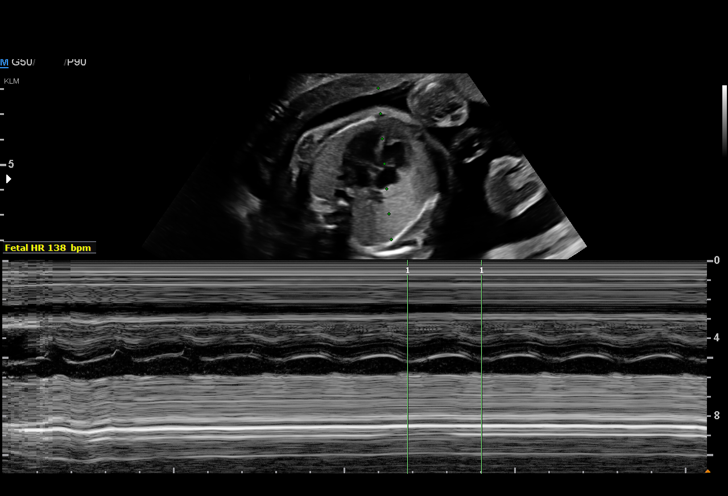
[im 8/42]
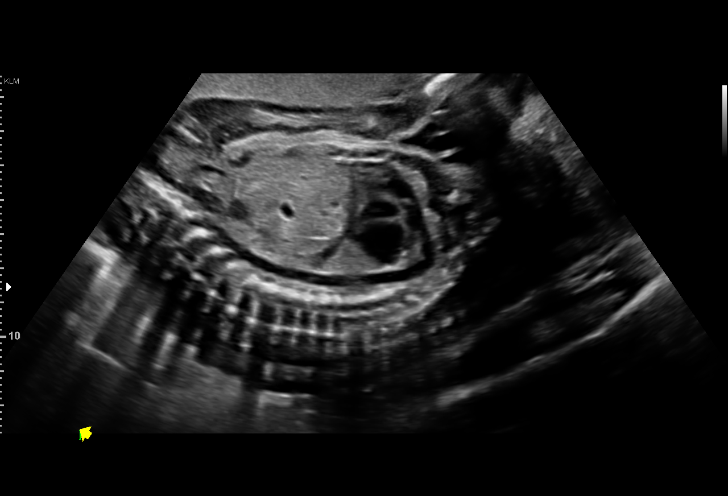
[im 11/42]
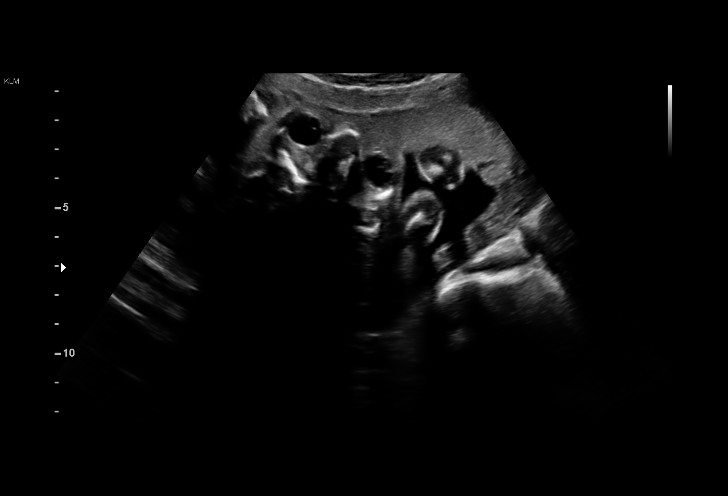
[im 14/42]
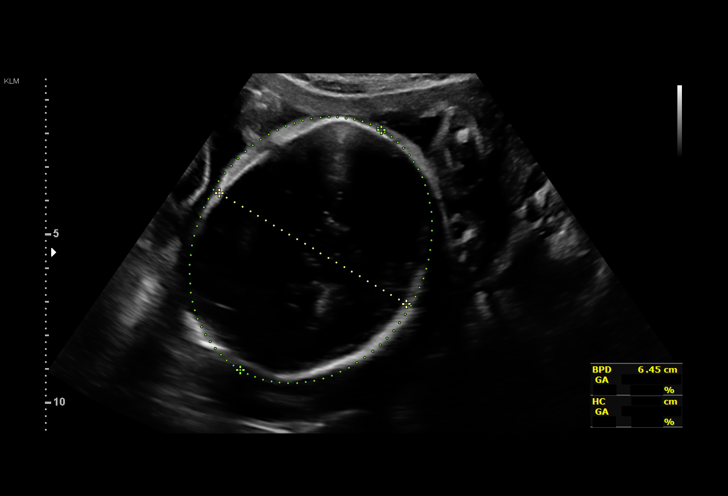
[im 17/42]
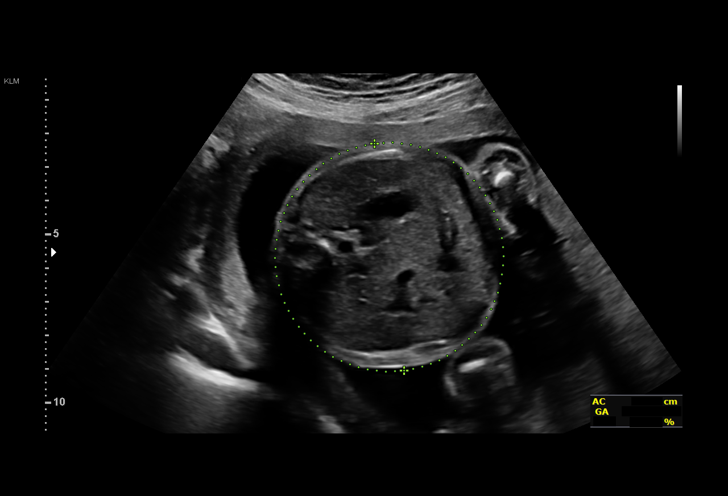
[im 22/42]
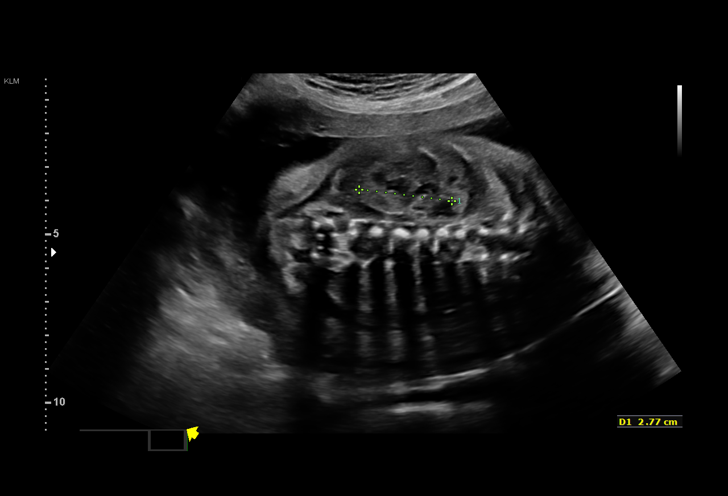
[im 25/42]
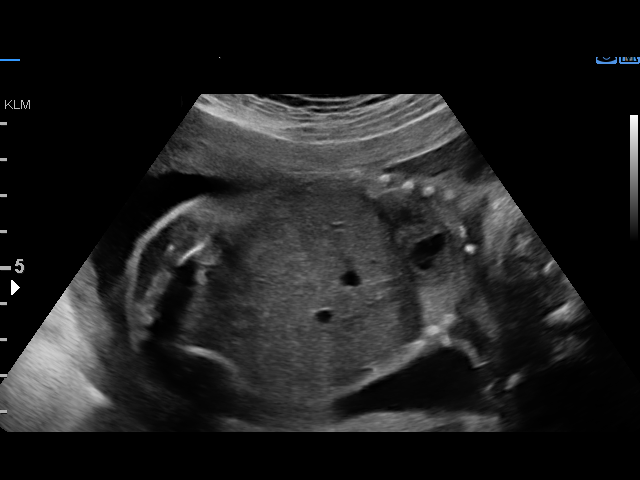
[im 28/42]
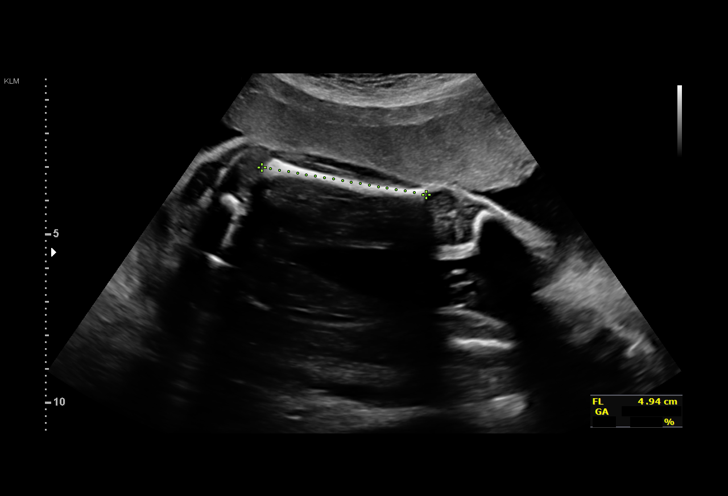
[im 31/42]
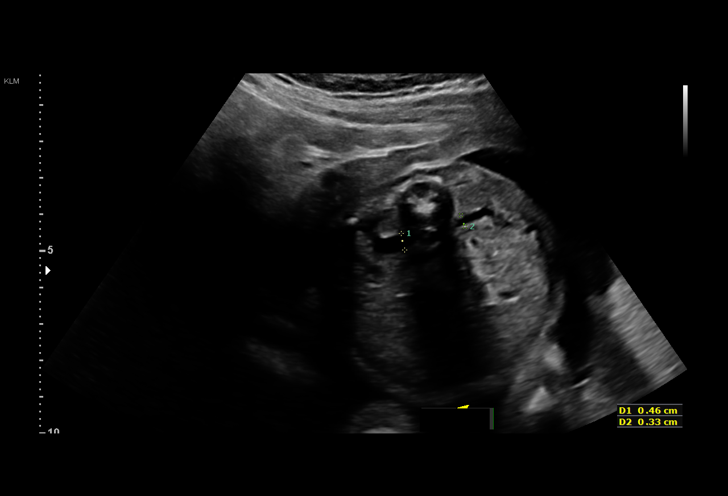
[im 34/42]
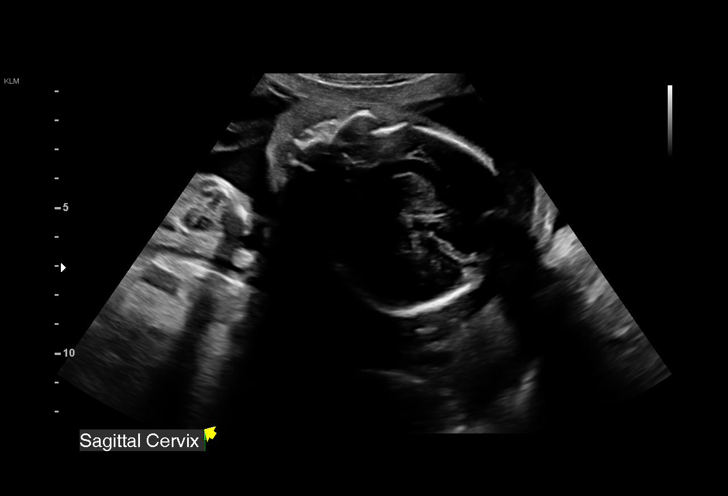
[im 37/42]
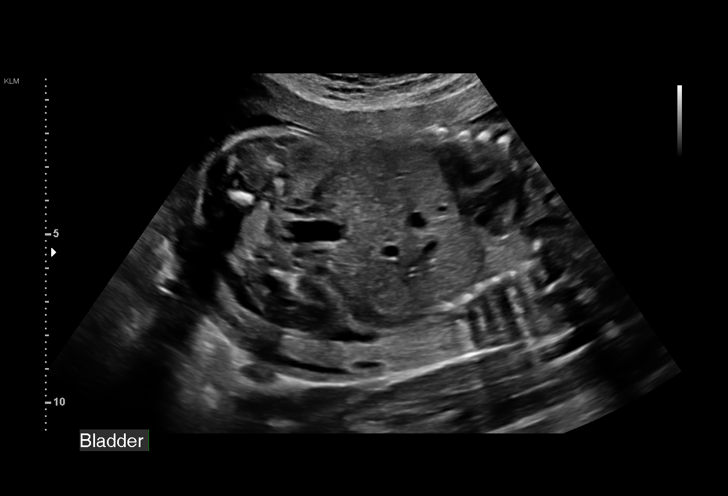
[im 40/42]
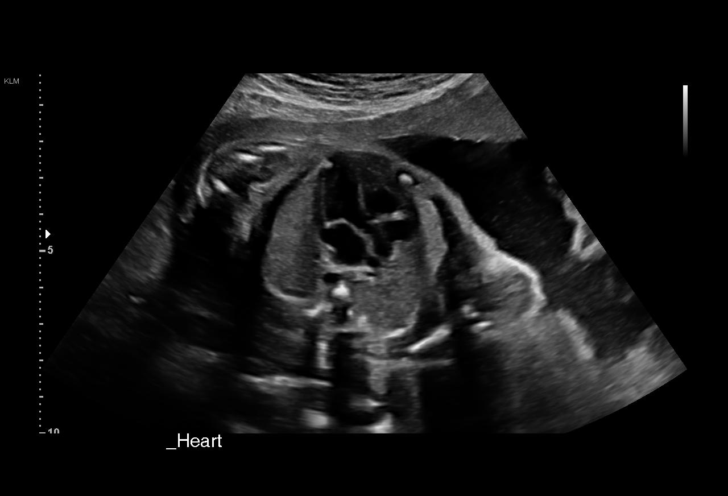

[13 of 28 positions shown; findings below may reference images not displayed]

[REDACTED]
                   [REDACTED]care - [HOSPITAL]

 ----------------------------------------------------------------------

 ----------------------------------------------------------------------
Indications

  Antenatal follow-up for nonvisualized fetal
  anatomy
  26 weeks gestation of pregnancy
  Seizure disorder (no meds, no seizure since
  7177)
  Echogenic bowel
  Pyelectasis of fetus on prenatal ultrasound
 ----------------------------------------------------------------------
Vital Signs

                                                Height:        5'7"
Fetal Evaluation

 Num Of Fetuses:         1
 Fetal Heart Rate(bpm):  138
 Cardiac Activity:       Observed
 Presentation:           Cephalic
 Placenta:               Anterior
 P. Cord Insertion:      Visualized

 Amniotic Fluid
 AFI FV:      Within normal limits

                             Largest Pocket(cm)

Biometry

 BPD:      63.8  mm     G. Age:  25w 6d         29  %    CI:        73.26   %    70 - 86
                                                         FL/HC:      20.6   %    18.6 -
 HC:      236.9  mm     G. Age:  25w 5d         16  %    HC/AC:      1.09        1.04 -
 AC:      216.6  mm     G. Age:  26w 1d         42  %    FL/BPD:     76.6   %    71 - 87
 FL:       48.9  mm     G. Age:  26w 3d         46  %    FL/AC:      22.6   %    20 - 24

 Est. FW:     903  gm           2 lb     55  %
OB History

 Gravidity:    5         Term:   3        Prem:   0        SAB:   1
 TOP:          0       Ectopic:  0        Living: 3
Gestational Age

 LMP:           27w 0d        Date:  09/27/17                 EDD:   07/04/18
 U/S Today:     26w 0d                                        EDD:   07/11/18
 Best:          26w 1d     Det. By:  Early Ultrasound         EDD:   07/10/18
                                     (12/04/17)
Anatomy

 Cranium:               Appears normal         Aortic Arch:            Appears normal
 Cavum:                 Previously seen        Ductal Arch:            Appears normal
 Ventricles:            Previously seen        Diaphragm:              Appears normal
 Choroid Plexus:        Previously seen        Stomach:                Appears normal, left
                                                                       sided
 Cerebellum:            Previously seen        Abdomen:                Echogenic Bowel
                                                                       prev. seen
 Posterior Fossa:       Previously seen        Abdominal Wall:         Previously seen
 Nuchal Fold:           Not applicable (>20    Cord Vessels:           Previously seen
                        wks GA)
 Face:                  Appears normal         Kidneys:                Appear normal
                        (orbits and profile)
 Lips:                  Appears normal         Bladder:                Appears normal
 Thoracic:              Appears normal         Spine:                  Previously seen
 Heart:                 Previously seen        Upper Extremities:      Previously seen
 RVOT:                  Previously seen        Lower Extremities:      Previously seen
 LVOT:                  Appears normal

 Other:  Fetus appears to be female. Heels and Lt 5th digit previously
         visualized. Technically difficult due to fetal position.
Cervix Uterus Adnexa

 Cervix
 Not visualized (advanced GA >59wks)
Impression

 Patient returned for completion of fetal anatomy.

 On cell-free fetal DNA screening, the risks of fetal
 aneuploidies are not increased.

 Amniotic fluid is normal and good fetal activity is seen. Fetal
 growth is appropriate for gestational age. Hyperechogenic
 bowel is not seen and kidneys appear normal.
 We reassured the patient of the findings.
Recommendations

 Follow-up as clinically indicated.
                 RAYES

## 2021-02-06 ENCOUNTER — Emergency Department (HOSPITAL_COMMUNITY)
Admission: EM | Admit: 2021-02-06 | Discharge: 2021-02-06 | Discharge status: Against Medical Advice | Payer: Medicaid Other | Attending: Emergency Medicine | Admitting: Emergency Medicine

## 2021-02-06 ENCOUNTER — Emergency Department (HOSPITAL_COMMUNITY): Payer: Medicaid Other

## 2021-02-06 ENCOUNTER — Encounter (HOSPITAL_COMMUNITY): Payer: Self-pay | Admitting: *Deleted

## 2021-02-06 ENCOUNTER — Other Ambulatory Visit: Payer: Self-pay

## 2021-02-06 DIAGNOSIS — R112 Nausea with vomiting, unspecified: Secondary | ICD-10-CM | POA: Diagnosis not present

## 2021-02-06 DIAGNOSIS — R55 Syncope and collapse: Secondary | ICD-10-CM | POA: Diagnosis not present

## 2021-02-06 DIAGNOSIS — N9489 Other specified conditions associated with female genital organs and menstrual cycle: Secondary | ICD-10-CM | POA: Insufficient documentation

## 2021-02-06 DIAGNOSIS — R569 Unspecified convulsions: Secondary | ICD-10-CM | POA: Diagnosis not present

## 2021-02-06 DIAGNOSIS — Z5321 Procedure and treatment not carried out due to patient leaving prior to being seen by health care provider: Secondary | ICD-10-CM | POA: Diagnosis not present

## 2021-02-06 LAB — BASIC METABOLIC PANEL
Anion gap: 7 (ref 5–15)
BUN: 22 mg/dL — ABNORMAL HIGH (ref 6–20)
CO2: 22 mmol/L (ref 22–32)
Calcium: 8.6 mg/dL — ABNORMAL LOW (ref 8.9–10.3)
Chloride: 106 mmol/L (ref 98–111)
Creatinine, Ser: 0.83 mg/dL (ref 0.44–1.00)
GFR, Estimated: >60 mL/min (ref >60)
Glucose, Bld: 90 mg/dL (ref 70–99)
Potassium: 4 mmol/L (ref 3.5–5.1)
Sodium: 135 mmol/L (ref 135–145)

## 2021-02-06 LAB — CBC
HCT: 38.4 % (ref 36.0–46.0)
Hemoglobin: 11.5 g/dL — ABNORMAL LOW (ref 12.0–15.0)
MCH: 24.3 pg — ABNORMAL LOW (ref 26.0–34.0)
MCHC: 29.9 g/dL — ABNORMAL LOW (ref 30.0–36.0)
MCV: 81.2 fL (ref 80.0–100.0)
Platelets: 283 10*3/uL (ref 150–400)
RBC: 4.73 MIL/uL (ref 3.87–5.11)
RDW: 16.6 % — ABNORMAL HIGH (ref 11.5–15.5)
WBC: 6 10*3/uL (ref 4.0–10.5)
nRBC: 0 % (ref 0.0–0.2)

## 2021-02-06 LAB — I-STAT BETA HCG BLOOD, ED (MC, WL, AP ONLY): I-stat hCG, quantitative: <5 m[IU]/mL (ref <5)

## 2021-02-06 NOTE — ED Notes (Signed)
Patient called for room placement x1 with no answer. 

## 2021-02-06 NOTE — ED Notes (Signed)
Patient called for room placement x2 with no answer. 

## 2021-02-06 NOTE — ED Notes (Signed)
Pt. Was called for reassess vitals with no response x3.

## 2021-02-06 NOTE — ED Notes (Signed)
Patient called for room placement x3 with no answer. 

## 2021-02-06 NOTE — ED Triage Notes (Addendum)
BIB EMS while having a near syncopal episode while having a feeling of vomiting or having a BM.Also c/o slight chest pain. Pt alert and oriented upon arrival.120/86-70-95%% RA CBG 135   Pt feels like she had a seizure, she does have history of seizures, states she did pass out.

## 2021-02-06 NOTE — ED Provider Triage Note (Signed)
Emergency Medicine Provider Triage Evaluation Note  Caitlin Rhodes , a 31 y.o. female  was evaluated in triage.  Pt complains of potential seizure this morning.  Patient reports that she woke up this morning feeling well however then she became nauseous and she thinks she was bent over the toilet to vomit the next and she remembers is being woken up by her 69-year-old daughter.  She reports feeling disoriented initially, however now she is at baseline.  She is alert and oriented x4.  She denies chest pain, shortness of breath.  She is not maintained on antiepileptic.  Reports her last seizure was in 2010.  States she last saw her neurologist was after her last seizure in 2010.Marland Kitchen  Review of Systems  Positive: As above Negative: As above  Physical Exam  BP (!) 188/88 (BP Location: Left Arm)    Pulse 72    Temp 97.7 F (36.5 C) (Oral)    Resp 15    Ht 5\' 7"  (1.702 m)    Wt 65.8 kg    SpO2 100%    BMI 22.71 kg/m  Gen:   Awake, no distress   Resp:  Normal effort  MSK:   Moves extremities without difficulty  Other:    Medical Decision Making  Medically screening exam initiated at 10:36 AM.  Appropriate orders placed.  Caitlin Rhodes was informed that the remainder of the evaluation will be completed by another provider, this initial triage assessment does not replace that evaluation, and the importance of remaining in the ED until their evaluation is complete.     [name], PA-C 02/06/21 1038

## 2021-04-08 ENCOUNTER — Ambulatory Visit: Payer: Medicaid Other | Admitting: Neurology

## 2021-04-08 ENCOUNTER — Encounter: Payer: Self-pay | Admitting: Neurology

## 2021-04-08 VITALS — BP 119/80 | HR 78 | Ht 67.0 in | Wt 144.0 lb

## 2021-04-08 DIAGNOSIS — R569 Unspecified convulsions: Secondary | ICD-10-CM | POA: Diagnosis not present

## 2021-04-08 DIAGNOSIS — G44229 Chronic tension-type headache, not intractable: Secondary | ICD-10-CM | POA: Diagnosis not present

## 2021-04-08 MED ORDER — AMITRIPTYLINE HCL 25 MG PO TABS: 25.0000 mg | ORAL_TABLET | Freq: Every day | ORAL | 3 refills | Status: DC

## 2021-04-08 NOTE — Progress Notes (Signed)
? ?[facility] ? ?PATIENT: Caitlin Rhodes ?DOB: [birth year] ? ?REQUESTING CLINICIAN: Barnie Mort, PA-C ?HISTORY FROM: Patient  ?REASON FOR VISIT: Seizure vs. Syncope  ? ? ?HISTORICAL ? ?CHIEF COMPLAINT:  ?Chief Complaint  ?Patient presents with  ? New Patient (Initial Visit)  ?  Rm 12. Alone. ?NP/Paper/Kerra Jean Rosenthal PA [facility]/Seizures, surgery clearance. ?Pt states she has spells of "passing out". States she feels disoriented after event. During events she may urinate herself. During last event she felt very disoriented for two days. Had nausea during event. More frequent when she was between 3-5. Pt c/o headache in occipital area, states pain may surge behind eye. C/o a couple of headache days per week.  ? ? ?HISTORY OF PRESENT ILLNESS:  ?This is a 31 year old woman with past medical history of headaches, depression who is presenting for seizure versus syncope.  Patient states that on January 14, she woke up in the morning, not feeling good, reports the night before she was had a headache.  She went to the bathroom trying to vomit and next thing that she know she is on the floor with her daughter calling her name.  She was initially confused, she noted that she urinated on herself and she called EMS.  She was taken to the ED, but left before all work-up completed.  Patient stated prior that to event, she had a similar event back in 2010.  She reported she was at home, again she was not feeling well, complaining of abdominal pain, feels like she wants to throw up, went to the bathroom and she was found on the floor by her husband, he noted that her body was all tensed up and she also urinated on herself.  Prior to 2010 she had an event in 2009 while in her gym class.  Again she was doing exercise, start feeling abdominal pain, she was going to sit down and passed out.  She has never seen a neurologist for the full work-up.  ?She was told by mother that between the age of 1 of  3 she did have frequent syncopal episode, she believes at that time she did have a EEG was told that everything was normal.  She denies any family history of seizures, and no other seizure risk factors identified ?She currently works at the school.  ?Patient also complains of headaches, increased frequency, currently she is having 3-4 headaches per week, and headaches can last for days.  She is stating currently she had a headache that lasted for 4 days.  Her primary care doctor prescribed her sumatriptan but she has not started it.  She has never been on a preventive medication for headaches. ? ?Handedness: Right handed  ? ?Onset: 2009 ? ?Seizure Type: unclear, possibly partial  ? ?Current frequency: Last episode Jan 2023, previous episode in 2010 ? ?Any injuries from seizures: None reported  ? ?Seizure risk factors: Bruises  ? ?Previous ASMs: None  ? ?Currenty ASMs: None  ? ?ASMs side effects: Not applicable  ? ?Brain Images: None available ? ?Previous EEGs: None available ? ? ?OTHER MEDICAL CONDITIONS: Headaches, depression. ? ?REVIEW OF SYSTEMS: Full 14 system review of systems performed and negative with exception of: As noted in the HPI  ? ?ALLERGIES: ?Not on File ? ?HOME MEDICATIONS: ?Outpatient Medications Prior to Visit  ?Medication Sig Dispense Refill  ? ibuprofen (ADVIL) 800 MG tablet Take 1 tablet (800 mg total) by mouth 3 (three) times daily. Take 1 tablet three times a  day with food x 5 days then may take as needed 21 tablet 0  ? predniSONE (DELTASONE) 20 MG tablet Take 1 tablet (20 mg total) by mouth 2 (two) times daily with a meal. 10 tablet 0  ? Probiotic Product (PROBIOTIC PO) Take by mouth.    ? sertraline (ZOLOFT) 50 MG tablet Take 1 tablet (50 mg total) by mouth daily. 30 tablet 5  ? Prenatal MV-Min-FA-Omega-3 (PRENATAL GUMMIES/DHA & FA) 0.4-32.5 MG CHEW Chew 1 tablet by mouth daily before breakfast. 30 tablet 5  ? ?No facility-administered medications prior to visit.  ? ? ?PAST MEDICAL  HISTORY: ?Past Medical History:  ?Diagnosis Date  ? Seizures (HCC)   ? ? ?PAST SURGICAL HISTORY: ?Past Surgical History:  ?Procedure Laterality Date  ? NO PAST SURGERIES    ? ? ?FAMILY HISTORY: ?Family History  ?Problem Relation Age of Onset  ? Cancer Maternal Grandmother   ? Cancer Paternal Grandmother   ? ? ?SOCIAL HISTORY: ?Social History  ? ?Socioeconomic History  ? Marital status: Single  ?  Spouse name: Not on file  ? Number of children: Not on file  ? Years of education: Not on file  ? Highest education level: Not on file  ?Occupational History  ? Not on file  ?Tobacco Use  ? Smoking status: Never  ? Smokeless tobacco: Never  ?Vaping Use  ? Vaping Use: Never used  ?Substance and Sexual Activity  ? Alcohol use: Not Currently  ? Drug use: No  ? Sexual activity: Yes  ?  Partners: Male  ?  Birth control/protection: None  ?Other Topics Concern  ? Not on file  ?Social History Narrative  ? Not on file  ? ?Social Determinants of Health  ? ?Financial Resource Strain: Not on file  ?Food Insecurity: Not on file  ?Transportation Needs: Not on file  ?Physical Activity: Not on file  ?Stress: Not on file  ?Social Connections: Not on file  ?Intimate Partner Violence: Not on file  ? ? ?PHYSICAL EXAM ? ?GENERAL EXAM/CONSTITUTIONAL: ?Vitals:  ?Vitals:  ? 04/08/21 1420  ?BP: 119/80  ?Pulse: 78  ?Weight: 144 lb (65.3 kg)  ?Height: 5\' 7"  (1.702 m)  ? ?Body mass index is 22.55 kg/m?. ?Wt Readings from Last 3 Encounters:  ?04/08/21 144 lb (65.3 kg)  ?02/06/21 145 lb (65.8 kg)  ?11/16/18 150 lb (68 kg)  ? ?Patient is in no distress; well developed, nourished and groomed; neck is supple ? ?CARDIOVASCULAR: ?Examination of carotid arteries is normal; no carotid bruits ?Regular rate and rhythm, no murmurs ?Examination of peripheral vascular system by observation and palpation is normal ? ?EYES: ?Pupils round and reactive to light, Visual fields full to confrontation, Extraocular movements intacts,  ?No results  found. ? ?MUSCULOSKELETAL: ?Gait, strength, tone, movements noted in Neurologic exam below ? ?NEUROLOGIC: ?MENTAL STATUS:  ?No flowsheet data found. ?awake, alert, oriented to person, place and time ?recent and remote memory intact ?normal attention and concentration ?language fluent, comprehension intact, naming intact ?fund of knowledge appropriate ? ?CRANIAL NERVE:  ?2nd, 3rd, 4th, 6th - pupils equal and reactive to light, visual fields full to confrontation, extraocular muscles intact, no nystagmus ?5th - facial sensation symmetric ?7th - facial strength symmetric ?8th - hearing intact ?9th - palate elevates symmetrically, uvula midline ?11th - shoulder shrug symmetric ?12th - tongue protrusion midline ? ?MOTOR:  ?normal bulk and tone, full strength in the BUE, BLE ? ?SENSORY:  ?normal and symmetric to light touch, pinprick, temperature, vibration ? ?COORDINATION:  ?  finger-nose-finger, fine finger movements normal ? ?REFLEXES:  ?deep tendon reflexes present and symmetric ? ?GAIT/STATION:  ?normal ? ? ?DIAGNOSTIC DATA (LABS, IMAGING, TESTING) ?- I reviewed patient records, labs, notes, testing and imaging myself where available. ? ?Lab Results  ?Component Value Date  ? WBC 6.0 02/06/2021  ? HGB 11.5 (L) 02/06/2021  ? HCT 38.4 02/06/2021  ? MCV 81.2 02/06/2021  ? PLT 283 02/06/2021  ? ?   ?Component Value Date/Time  ? NA 135 02/06/2021 1032  ? K 4.0 02/06/2021 1032  ? CL 106 02/06/2021 1032  ? CO2 22 02/06/2021 1032  ? GLUCOSE 90 02/06/2021 1032  ? BUN 22 (H) 02/06/2021 1032  ? CREATININE 0.83 02/06/2021 1032  ? CREATININE 0.93 09/01/2012 1448  ? CALCIUM 8.6 (L) 02/06/2021 1032  ? PROT 8.0 06/03/2013 0527  ? ALBUMIN 4.4 06/03/2013 0527  ? AST 41 (H) 06/03/2013 0527  ? ALT 48 (H) 06/03/2013 0527  ? ALKPHOS 89 06/03/2013 0527  ? BILITOT 0.3 06/03/2013 0527  ? GFRNONAA >60 02/06/2021 1032  ? GFRAA >90 06/03/2013 0527  ? ?No results found for: CHOL, HDL, LDLCALC, LDLDIRECT, TRIG ?No results found for: HGBA1C ?No  results found for: VITAMINB12 ?Lab Results  ?Component Value Date  ? TSH 0.627 09/01/2012  ? ? ? ?ASSESSMENT AND PLAN ? ?31 y.o. year old female  with headaches and depression who is presenting with seizure-like activity.  All events start

## 2021-04-08 NOTE — Patient Instructions (Addendum)
Routine EEG ?MRI brain with and without contrast ?I will contact you after the completion of result, if abnormal I will bring you sooner to discuss further studies/medication. ?Start amitriptyline 25 mg, 1/2 tablet nightly for 1 week then increase to full tablet thereafter for headaches prevention ?Take sumatriptan as soon as the headache start, can take additional dose 2 hours later if the headache did not resolve, but do not take more than 2 tablets in a 24-hour period. ?Follow-up with your primary care doctor and return sooner if worse ?

## 2021-04-09 ENCOUNTER — Telehealth: Payer: Self-pay | Admitting: Neurology

## 2021-04-09 NOTE — Telephone Encounter (Signed)
[facility] community auth: [id number] (exp. 04/09/21 to 05/24/21) order sent to GI, they will reach out to the patient to schedule.  ?

## 2021-04-20 ENCOUNTER — Ambulatory Visit: Payer: Medicaid Other | Admitting: Neurology

## 2021-04-20 DIAGNOSIS — R569 Unspecified convulsions: Secondary | ICD-10-CM | POA: Diagnosis not present

## 2021-04-20 NOTE — Procedures (Signed)
? ? ?  History: ? ?31 year old woman with seizure like activity  ? ?EEG classification:  Awake and asleep ? ?Description of the recording: The background rhythms of this recording consists of a fairly well modulated medium amplitude background activity of 10 Hz. As the record progresses, the patient initially is in the waking state, but appears to enter the early stage II sleep during the recording, with rudimentary sleep spindles and vertex sharp wave activity seen. During the wakeful state, photic stimulation is performed, and no abnormal responses were seen. Hyperventilation was also performed, no abnormal response seen. No epileptiform discharges seen during this recording. There was no focal slowing. EKG monitor shows no evidence of cardiac rhythm abnormalities with a heart rate of 60. ? ?Abnormality: None  ? ?Impression: This is a normal EEG recording in the waking and sleeping state. No evidence interictal epileptiform discharges were seen at any time during the recording.  A normal EEG does not exclude a diagnosis of epilepsy.  ? ? ?Alric Ran, MD ?[facility]  ?

## 2021-04-23 ENCOUNTER — Ambulatory Visit
Admission: RE | Admit: 2021-04-23 | Discharge: 2021-04-23 | Disposition: A | Discharge status: Home / Self Care | Payer: Medicaid Other | Source: Ambulatory Visit | Attending: Neurology | Admitting: Neurology

## 2021-04-23 DIAGNOSIS — R569 Unspecified convulsions: Secondary | ICD-10-CM

## 2021-04-23 MED ORDER — GADOBENATE DIMEGLUMINE 529 MG/ML IV SOLN
13.0000 mL | Freq: Once | INTRAVENOUS | Status: AC | PRN
  Administered 2021-04-23: 13 mL via INTRAVENOUS

## 2021-04-24 HISTORY — PX: BREAST ENHANCEMENT SURGERY: SHX7

## 2021-05-08 ENCOUNTER — Telehealth: Payer: Medicaid Other | Admitting: Physician Assistant

## 2021-05-08 ENCOUNTER — Encounter: Payer: Self-pay | Admitting: Physician Assistant

## 2021-05-08 DIAGNOSIS — B3731 Acute candidiasis of vulva and vagina: Secondary | ICD-10-CM | POA: Diagnosis not present

## 2021-05-08 MED ORDER — FLUCONAZOLE 150 MG PO TABS: 150.0000 mg | ORAL_TABLET | Freq: Once | ORAL | 1 refills | Status: AC

## 2021-05-08 NOTE — Progress Notes (Signed)
?Virtual Visit Consent  ? ?Caitlin Rhodes, you are scheduled for a virtual visit with a [facility] provider today.   ?  ?Just as with appointments in the office, your consent must be obtained to participate.  Your consent will be active for this visit and any virtual visit you may have with one of our providers in the next 365 days.   ?  ?If you have a MyChart account, a copy of this consent can be sent to you electronically.  All virtual visits are billed to your insurance company just like a traditional visit in the office.   ? ?As this is a virtual visit, video technology does not allow for your provider to perform a traditional examination.  This may limit your provider's ability to fully assess your condition.  If your provider identifies any concerns that need to be evaluated in person or the need to arrange testing (such as labs, EKG, etc.), we will make arrangements to do so.   ?  ?Although advances in technology are sophisticated, we cannot ensure that it will always work on either your end or our end.  If the connection with a video visit is poor, the visit may have to be switched to a telephone visit.  With either a video or telephone visit, we are not always able to ensure that we have a secure connection.    ? ?I need to obtain your verbal consent now.   Are you willing to proceed with your visit today?  ?  ?Caitlin Rhodes has provided verbal consent on 05/08/2021 for a virtual visit (video or telephone). ?  ?[name], PA-C  ? ?Date: 05/08/2021 9:34 AM ? ? ?Virtual Visit via Video Note  ? ?IRoney Jaffe, connected with  Caitlin Rhodes  ([id number], [birth year]) on 05/08/21 at  9:30 AM EDT by a video-enabled telemedicine application and verified that I am speaking with the correct person using two identifiers. ? ?Location: ?Patient: Virtual Visit Location Patient: Home ?Provider: Virtual Visit Location Provider: Home Office ?  ?I discussed the limitations of evaluation  and management by telemedicine and the availability of in person appointments. The patient expressed understanding and agreed to proceed.   ? ?History of Present Illness: ?Caitlin Rhodes is a 31 y.o. who identifies as a female who was assigned female at birth, and is being seen today for possible vaginal yeast infection. ? ?States that she was recently seen by her primary care provider and had urinary testing completed, states that she was negative for any STIs.  States that she did start having vaginal itching over the past couple days, states that she has also been having a gritty white discharge.  Does endorse history of vaginal yeast infections, states this feels very similar ? ?Denies dysuria, suprapubic pain, lower back pain, fever, chills, nausea, vomiting. ? ? ?HPI: HPI  ?Problems:  ?Patient Active Problem List  ? Diagnosis Date Noted  ? Indication for care or intervention in labor or delivery 07/04/2018  ? SVD (spontaneous vaginal delivery) 07/04/2018  ? Fall 06/15/2018  ? Unwanted fertility 06/13/2018  ? Abnormal fetal ultrasound 03/22/2018  ? Supervision of normal pregnancy, antepartum 02/22/2018  ? Seizure disorder (HCC) 09/01/2012  ?  ?Allergies: Not on File ?Medications:  ?Current Outpatient Medications:  ?  fluconazole (DIFLUCAN) 150 MG tablet, Take 1 tablet (150 mg total) by mouth once for 1 dose., Disp: 1 tablet, Rfl: 1 ?  amitriptyline (ELAVIL) 25 MG tablet, Take  1 tablet (25 mg total) by mouth at bedtime., Disp: 30 tablet, Rfl: 3 ?  ibuprofen (ADVIL) 800 MG tablet, Take 1 tablet (800 mg total) by mouth 3 (three) times daily. Take 1 tablet three times a day with food x 5 days then may take as needed, Disp: 21 tablet, Rfl: 0 ?  predniSONE (DELTASONE) 20 MG tablet, Take 1 tablet (20 mg total) by mouth 2 (two) times daily with a meal., Disp: 10 tablet, Rfl: 0 ?  Probiotic Product (PROBIOTIC PO), Take by mouth., Disp: , Rfl:  ? ?Observations/Objective: ?Patient is well-developed,  well-nourished in no acute distress.  ?Resting comfortably  at home.  ?Head is normocephalic, atraumatic.  ?No labored breathing.  ?Speech is clear and coherent with logical content.  ?Patient is alert and oriented at baseline.  ? ? ?Assessment and Plan: ?1. Yeast infection of the vagina ?- fluconazole (DIFLUCAN) 150 MG tablet; Take 1 tablet (150 mg total) by mouth once for 1 dose.  Dispense: 1 tablet; Refill: 1 ? ?Patient education given on supportive care.  Patient did state that previous attempts for curative with Diflucan did require second dosing.  Did send 1 tablet with 1 refill. ? ?Follow Up Instructions: ?I discussed the assessment and treatment plan with the patient. The patient was provided an opportunity to ask questions and all were answered. The patient agreed with the plan and demonstrated an understanding of the instructions.  A copy of instructions were sent to the patient via MyChart unless otherwise noted below.  ? ? ? ?The patient was advised to call back or seek an in-person evaluation if the symptoms worsen or if the condition fails to improve as anticipated. ? ?Time:  ?I spent 12 minutes with the patient via telehealth technology discussing the above problems/concerns.   ? ?[name], PA-C ? ?

## 2021-05-08 NOTE — Patient Instructions (Signed)
?Caitlin Rhodes, thank you for joining Kennieth Rad, PA-C for today's virtual visit.  While this provider is not your primary care provider (PCP), if your PCP is located in our provider database this encounter information will be shared with them immediately following your visit. ? ?Consent: ?(Patient) Caitlin Rhodes provided verbal consent for this virtual visit at the beginning of the encounter. ? ?Current Medications: ? ?Current Outpatient Medications:  ?  fluconazole (DIFLUCAN) 150 MG tablet, Take 1 tablet (150 mg total) by mouth once for 1 dose., Disp: 1 tablet, Rfl: 1 ?  amitriptyline (ELAVIL) 25 MG tablet, Take 1 tablet (25 mg total) by mouth at bedtime., Disp: 30 tablet, Rfl: 3 ?  ibuprofen (ADVIL) 800 MG tablet, Take 1 tablet (800 mg total) by mouth 3 (three) times daily. Take 1 tablet three times a day with food x 5 days then may take as needed, Disp: 21 tablet, Rfl: 0 ?  predniSONE (DELTASONE) 20 MG tablet, Take 1 tablet (20 mg total) by mouth 2 (two) times daily with a meal., Disp: 10 tablet, Rfl: 0 ?  Probiotic Product (PROBIOTIC PO), Take by mouth., Disp: , Rfl:   ? ?Medications ordered in this encounter:  ?Meds ordered this encounter  ?Medications  ? fluconazole (DIFLUCAN) 150 MG tablet  ?  Sig: Take 1 tablet (150 mg total) by mouth once for 1 dose.  ?  Dispense:  1 tablet  ?  Refill:  1  ?  Order Specific Question:   Supervising Provider  ?  Answer:   Noemi Chapel [[id number]]  ?  ? ?*If you need refills on other medications prior to your next appointment, please contact your pharmacy* ? ?Follow-Up: ?Call back or seek an in-person evaluation if the symptoms worsen or if the condition fails to improve as anticipated. ? ?Other Instructions ?Vaginal Yeast Infection, Adult ? ?Vaginal yeast infection is a condition that causes vaginal discharge as well as soreness, swelling, and redness (inflammation) of the vagina. This is a common condition. Some women get this infection frequently. ?What  are the causes? ?This condition is caused by a change in the normal balance of the yeast (Candida) and normal bacteria that live in the vagina. This change causes an overgrowth of yeast, which causes the inflammation. ?What increases the risk? ?The condition is more likely to develop in women who: ?Take antibiotic medicines. ?Have diabetes. ?Take birth control pills. ?Are pregnant. ?Douche often. ?Have a weak body defense system (immune system). ?Have been taking steroid medicines for a long time. ?Frequently wear tight clothing. ?What are the signs or symptoms? ?Symptoms of this condition include: ?White, thick, creamy vaginal discharge. ?Swelling, itching, redness, and irritation of the vagina. The lips of the vagina (labia) may be affected as well. ?Pain or a burning feeling while urinating. ?Pain during sex. ?How is this diagnosed? ?This condition is diagnosed based on: ?Your medical history. ?A physical exam. ?A pelvic exam. Your health care provider will examine a sample of your vaginal discharge under a microscope. Your health care provider may send this sample for testing to confirm the diagnosis. ?How is this treated? ?This condition is treated with medicine. Medicines may be over-the-counter or prescription. You may be told to use one or more of the following: ?Medicine that is taken by mouth (orally). ?Medicine that is applied as a cream (topically). ?Medicine that is inserted directly into the vagina (suppository). ?Follow these instructions at home: ?Take or apply over-the-counter and prescription medicines only as told by  your health care provider. ?Do not use tampons until your health care provider approves. ?Do not have sex until your infection has cleared. Sex can prolong or worsen your symptoms of infection. Ask your health care provider when it is safe to resume sexual activity. ?Keep all follow-up visits. This is important. ?How is this prevented? ? ?Do not wear tight clothes, such as pantyhose  or tight pants. ?Wear breathable cotton underwear. ?Do not use douches, perfumed soap, creams, or powders. ?Wipe from front to back after using the toilet. ?If you have diabetes, keep your blood sugar levels under control. ?Ask your health care provider for other ways to prevent yeast infections. ?Contact a health care provider if: ?You have a fever. ?Your symptoms go away and then return. ?Your symptoms do not get better with treatment. ?Your symptoms get worse. ?You have new symptoms. ?You develop blisters in or around your vagina. ?You have blood coming from your vagina and it is not your menstrual period. ?You develop pain in your abdomen. ?Summary ?Vaginal yeast infection is a condition that causes discharge as well as soreness, swelling, and redness (inflammation) of the vagina. ?This condition is treated with medicine. Medicines may be over-the-counter or prescription. ?Take or apply over-the-counter and prescription medicines only as told by your health care provider. ?Do not douche. Resume sexual activity or use of tampons as instructed by your health care provider. ?Contact a health care provider if your symptoms do not get better with treatment or your symptoms go away and then return. ?This information is not intended to replace advice given to you by your health care provider. Make sure you discuss any questions you have with your health care provider. ?Document Revised: 03/30/2020 Document Reviewed: 03/30/2020 ?Elsevier Patient Education ? [place]. ? ? ? ?If you have been instructed to have an in-person evaluation today at a local Urgent Care facility, please use the link below. It will take you to a list of all of our available [facility], including address, phone number and hours of operation. Please do not delay care.  ?[facility] ? ?If you or a family member do not have a primary care provider, use the link below to schedule a visit and establish care. When you  choose a [facility] primary care physician or advanced practice provider, you gain a long-term partner in health. ?Find a Primary Care Provider ? ?Learn more about 's in-office and virtual care options: ?[facility] ? ?

## 2021-06-14 ENCOUNTER — Telehealth: Payer: Medicaid Other | Admitting: Family

## 2021-06-14 DIAGNOSIS — N760 Acute vaginitis: Secondary | ICD-10-CM | POA: Diagnosis not present

## 2021-06-14 DIAGNOSIS — B9689 Other specified bacterial agents as the cause of diseases classified elsewhere: Secondary | ICD-10-CM | POA: Diagnosis not present

## 2021-06-14 MED ORDER — METRONIDAZOLE 500 MG PO TABS: 500.0000 mg | ORAL_TABLET | Freq: Two times a day (BID) | ORAL | 0 refills | Status: DC

## 2021-06-14 MED ORDER — FLUCONAZOLE 150 MG PO TABS: 150.0000 mg | ORAL_TABLET | ORAL | 0 refills | Status: DC | PRN

## 2021-06-14 NOTE — Addendum Note (Signed)
Addended by: Jannifer Rodney A on: 06/14/2021 07:29 PM   Modules accepted: Orders

## 2021-06-14 NOTE — Progress Notes (Signed)

## 2021-06-27 ENCOUNTER — Encounter: Payer: Medicaid Other | Admitting: Nurse Practitioner

## 2021-06-27 DIAGNOSIS — Z3009 Encounter for other general counseling and advice on contraception: Secondary | ICD-10-CM

## 2021-06-27 NOTE — Progress Notes (Signed)
Unfortunately We do not provide emergency contraception.   Because you are requesting Emergency contraception , I feel your condition warrants further evaluation and I recommend that you be seen for a face to face visit.  Please contact your primary care physician practice to be seen.   NOTE: You will NOT be charged for this eVisit.  If you do not have a PCP, [facility] offers a free physician referral service available at [phone]. Our trained staff has the experience, knowledge and resources to put you in touch with a physician who is right for you.    If you are having a true medical emergency please call 911.   Your e-visit answers were reviewed by a board certified advanced clinical practitioner to complete your personal care plan.  Thank you for using e-Visits.

## 2021-06-27 NOTE — Progress Notes (Signed)
I have spent 5 minutes in review of e-visit questionnaire, review and updating patient chart, medical decision making and response to patient.  ° °Kiyomi Pallo W Raye Wiens, NP ° °  °

## 2021-10-13 ENCOUNTER — Telehealth: Payer: Medicaid Other | Admitting: Physician Assistant

## 2021-10-13 DIAGNOSIS — B3731 Acute candidiasis of vulva and vagina: Secondary | ICD-10-CM

## 2021-10-13 MED ORDER — FLUCONAZOLE 150 MG PO TABS: 150.0000 mg | ORAL_TABLET | ORAL | 0 refills | Status: DC | PRN

## 2021-10-13 NOTE — Progress Notes (Signed)

## 2021-10-15 ENCOUNTER — Telehealth: Payer: Medicaid Other | Admitting: Physician Assistant

## 2021-10-15 DIAGNOSIS — B9689 Other specified bacterial agents as the cause of diseases classified elsewhere: Secondary | ICD-10-CM

## 2021-10-15 MED ORDER — METRONIDAZOLE 500 MG PO TABS: 500.0000 mg | ORAL_TABLET | Freq: Two times a day (BID) | ORAL | 0 refills | Status: AC

## 2021-10-15 NOTE — Progress Notes (Signed)

## 2021-11-10 ENCOUNTER — Other Ambulatory Visit: Payer: Self-pay | Admitting: Physician Assistant

## 2021-11-10 DIAGNOSIS — B3731 Acute candidiasis of vulva and vagina: Secondary | ICD-10-CM

## 2021-11-27 ENCOUNTER — Emergency Department (HOSPITAL_COMMUNITY)
Admission: EM | Admit: 2021-11-27 | Discharge: 2021-11-27 | Disposition: A | Discharge status: Home / Self Care | Payer: Medicaid Other | Attending: Emergency Medicine | Admitting: Emergency Medicine

## 2021-11-27 ENCOUNTER — Encounter (HOSPITAL_COMMUNITY): Payer: Self-pay

## 2021-11-27 DIAGNOSIS — Y900 Blood alcohol level of less than 20 mg/100 ml: Secondary | ICD-10-CM | POA: Insufficient documentation

## 2021-11-27 DIAGNOSIS — R569 Unspecified convulsions: Secondary | ICD-10-CM | POA: Diagnosis present

## 2021-11-27 DIAGNOSIS — R7989 Other specified abnormal findings of blood chemistry: Secondary | ICD-10-CM | POA: Insufficient documentation

## 2021-11-27 DIAGNOSIS — R258 Other abnormal involuntary movements: Secondary | ICD-10-CM | POA: Diagnosis not present

## 2021-11-27 DIAGNOSIS — R1084 Generalized abdominal pain: Secondary | ICD-10-CM | POA: Diagnosis not present

## 2021-11-27 DIAGNOSIS — R7401 Elevation of levels of liver transaminase levels: Secondary | ICD-10-CM | POA: Insufficient documentation

## 2021-11-27 LAB — COMPREHENSIVE METABOLIC PANEL
ALT: 61 U/L — ABNORMAL HIGH (ref 0–44)
AST: 66 U/L — ABNORMAL HIGH (ref 15–41)
Albumin: 4.2 g/dL (ref 3.5–5.0)
Alkaline Phosphatase: 58 U/L (ref 38–126)
Anion gap: 11 (ref 5–15)
BUN: 12 mg/dL (ref 6–20)
CO2: 23 mmol/L (ref 22–32)
Calcium: 9.2 mg/dL (ref 8.9–10.3)
Chloride: 105 mmol/L (ref 98–111)
Creatinine, Ser: 1.04 mg/dL — ABNORMAL HIGH (ref 0.44–1.00)
GFR, Estimated: >60 mL/min (ref >60)
Glucose, Bld: 92 mg/dL (ref 70–99)
Potassium: 3.9 mmol/L (ref 3.5–5.1)
Sodium: 139 mmol/L (ref 135–145)
Total Bilirubin: 0.5 mg/dL (ref 0.3–1.2)
Total Protein: 7.4 g/dL (ref 6.5–8.1)

## 2021-11-27 LAB — CBC WITH DIFFERENTIAL/PLATELET
Abs Immature Granulocytes: 0.02 10*3/uL (ref 0.00–0.07)
Basophils Absolute: 0 10*3/uL (ref 0.0–0.1)
Basophils Relative: 0 %
Eosinophils Absolute: 0 10*3/uL (ref 0.0–0.5)
Eosinophils Relative: 0 %
HCT: 39.7 % (ref 36.0–46.0)
Hemoglobin: 12.5 g/dL (ref 12.0–15.0)
Immature Granulocytes: 0 %
Lymphocytes Relative: 13 %
Lymphs Abs: 1.2 10*3/uL (ref 0.7–4.0)
MCH: 27.2 pg (ref 26.0–34.0)
MCHC: 31.5 g/dL (ref 30.0–36.0)
MCV: 86.5 fL (ref 80.0–100.0)
Monocytes Absolute: 0.5 10*3/uL (ref 0.1–1.0)
Monocytes Relative: 6 %
Neutro Abs: 7.5 10*3/uL (ref 1.7–7.7)
Neutrophils Relative %: 81 %
Platelets: 290 10*3/uL (ref 150–400)
RBC: 4.59 MIL/uL (ref 3.87–5.11)
RDW: 15.3 % (ref 11.5–15.5)
WBC: 9.4 10*3/uL (ref 4.0–10.5)
nRBC: 0 % (ref 0.0–0.2)

## 2021-11-27 LAB — ETHANOL: Alcohol, Ethyl (B): <10 mg/dL (ref <10)

## 2021-11-27 LAB — I-STAT BETA HCG BLOOD, ED (MC, WL, AP ONLY): I-stat hCG, quantitative: <5 m[IU]/mL (ref <5)

## 2021-11-27 LAB — CBG MONITORING, ED: Glucose-Capillary: 86 mg/dL (ref 70–99)

## 2021-11-27 MED ORDER — ONDANSETRON HCL 4 MG/2ML IJ SOLN
4.0000 mg | Freq: Once | INTRAMUSCULAR | Status: AC
  Administered 2021-11-27: 4 mg via INTRAVENOUS
  Filled 2021-11-27: qty 2

## 2021-11-27 MED ORDER — LEVETIRACETAM 500 MG PO TABS: 500.0000 mg | ORAL_TABLET | Freq: Two times a day (BID) | ORAL | 0 refills | Status: DC

## 2021-11-27 MED ORDER — LEVETIRACETAM IN NACL 1000 MG/100ML IV SOLN
1000.0000 mg | Freq: Once | INTRAVENOUS | Status: AC
  Administered 2021-11-27: 1000 mg via INTRAVENOUS
  Filled 2021-11-27: qty 100

## 2021-11-27 MED ORDER — ACETAMINOPHEN 325 MG PO TABS
650.0000 mg | ORAL_TABLET | Freq: Once | ORAL | Status: AC
  Administered 2021-11-27: 650 mg via ORAL
  Filled 2021-11-27: qty 2

## 2021-11-27 NOTE — ED Triage Notes (Signed)
Pt bib EMS after mom called and described her having what sounds like a [employer] seizure. Went out drinking last night. Had a similar event in January, but no diagnosis of seizures. Pt post ictal and only groaning to stimulus. Aox1.

## 2021-11-27 NOTE — ED Notes (Signed)
Pt is now more awake. Alert and oriented x 4. Pt is eating crackers. Obs until 1730. Will continue to monitor.

## 2021-11-27 NOTE — ED Provider Notes (Signed)
I received this patient in handoff from [facility], PA-C.  Please see her note for original history and work-up thus far.  In short patient is a 31 year old female who presented today after having 2 questionable seizures overnight.  She has a history of this and already has established with neurology.  She is not on any medications.  Plan is to observe her till 530 due to Keppra load and discharged her with Keppra.  Physical Exam  BP 117/73   Pulse 74   Temp 98.2 F (36.8 C) (Oral)   Resp 11   Ht 5\' 7"  (1.702 m)   Wt 65.3 kg   SpO2 100%   BMI 22.55 kg/m   Physical Exam Vitals and nursing note reviewed.  Constitutional:      Appearance: Normal appearance.  HENT:     Head: Normocephalic and atraumatic.  Eyes:     General: No scleral icterus.    Conjunctiva/sclera: Conjunctivae normal.  Pulmonary:     Effort: Pulmonary effort is normal. No respiratory distress.  Skin:    Findings: No rash.  Neurological:     Mental Status: She is alert.  Psychiatric:        Mood and Affect: Mood normal.     Procedures  Procedures  ED Course / MDM   Clinical Course as of 11/27/21 1618  Sat Nov 27, 2021  1410 ALT(!): 61 [CA]  1410 AST(!): 66 [CA]  1618 Patient reassessed bedside.  She reports feeling at her baseline.  Does report some headache and she is requesting another dose of Tylenol.  Ordered.  Eating without difficulty [MR]    Clinical Course User Index [CA] Suzy Bouchard, PA-C [MR] Darolyn Double, Cecilio Asper, PA-C   Medical Decision Making Amount and/or Complexity of Data Reviewed Labs: ordered. Decision-making details documented in ED Course.  Risk OTC drugs. Prescription drug management.    4:18pm: reassessed w/o concerns.  Able to eat.  Will be discharged at 530.  Patient placed for discharge      Darliss Ridgel 11/27/21 Isaiah Blakes, MD 11/27/21 1858

## 2021-11-27 NOTE — Discharge Instructions (Addendum)
Per [facility] statutes, patients with seizures are not allowed to drive until they have been seizure-free for six months.  Other recommendations include using caution when using heavy equipment or power tools. Avoid working on ladders or at heights. Take showers instead of baths.  Do not swim alone.  Ensure the water temperature is not too high on the home water heater. Do not go swimming alone. Do not lock yourself in a room alone (i.e. bathroom). When caring for infants or small children, sit down when holding, feeding, or changing them to minimize risk of injury to the child in the event you have a seizure. Maintain good sleep hygiene. Avoid alcohol.  Also recommend adequate sleep, hydration, good diet and minimize stress.

## 2021-11-27 NOTE — ED Provider Notes (Signed)
[facility] EMERGENCY DEPARTMENT Provider Note   CSN: [id number] Arrival date & time: 11/27/21  1212     History  Chief Complaint  Patient presents with   Seizures    Caitlin Rhodes is a 31 y.o. female with a past medical history significant for previous seizure-like activity who presents to the ED due to seizure-like activity.  Per EMS, patient went out drinking last night and had a possible seizure prior to arrival.  Patient admits to abdominal pain.  Difficult to obtain HPI.  Not answering many questions.  She appears drowsy. Admits to urinary continence.   Chart reviewed.  Patient seen by neurology on 04/08/2021 after a seizure-like activity that occurred in January 2023.  Prior to this episode patient had a similar incident in 2010.  Neurology note, patient's symptoms concerning for possible seizure.  Patient had a normal MRI and EEG.  Patient is not currently on any medications.  Friend at bedside witnessed seizure-like activity.  Friend states that around 4 AM this morning patient was riding in the car after drinking all night and "stiffened up" and began foaming at the mouth.  Friend states she poured water on the patient and patient "came to".  Patient was then put to bed and woke up this morning around 11 AM had a similar episode.  Friend states she stiffened up and thumb to the mouth.  Patient had an episode of urinary incontinence at 4 AM however, not at 11 AM.     Home Medications Prior to Admission medications   Medication Sig Start Date End Date Taking? Authorizing Provider  levETIRAcetam (KEPPRA) 500 MG tablet Take 1 tablet (500 mg total) by mouth 2 (two) times daily. 11/27/21 12/27/21 Yes Amara Justen, Druscilla Brownie, PA-C  amitriptyline (ELAVIL) 25 MG tablet Take 1 tablet (25 mg total) by mouth at bedtime. 04/08/21   Alric Ran, MD  fluconazole (DIFLUCAN) 150 MG tablet Take 1 tablet (150 mg total) by mouth every 3 (three) days as needed. 10/13/21    Mar Daring, PA-C  Probiotic Product (PROBIOTIC PO) Take by mouth.    [provider]      Allergies    Patient has no known allergies.    Review of Systems   Review of Systems  Constitutional:  Negative for chills and fever.  Respiratory:  Negative for shortness of breath.   Cardiovascular:  Negative for chest pain.  Gastrointestinal:  Positive for abdominal pain, nausea and vomiting. Negative for diarrhea.  Neurological:  Positive for headaches.  All other systems reviewed and are negative.   Physical Exam Updated Vital Signs BP 123/86   Pulse 80   Temp 98.2 F (36.8 C) (Oral)   Resp 18   Ht 5\' 7"  (1.702 m)   Wt 65.3 kg   SpO2 99%   BMI 22.55 kg/m  Physical Exam Vitals and nursing note reviewed.  Constitutional:      General: She is not in acute distress.    Appearance: She is not ill-appearing.  HENT:     Head: Normocephalic.  Eyes:     Pupils: Pupils are equal, round, and reactive to light.  Cardiovascular:     Rate and Rhythm: Normal rate and regular rhythm.     Pulses: Normal pulses.     Heart sounds: Normal heart sounds. No murmur heard.    No friction rub. No gallop.  Pulmonary:     Effort: Pulmonary effort is normal.     Breath sounds: Normal  breath sounds.  Abdominal:     General: Abdomen is flat. There is no distension.     Palpations: Abdomen is soft.     Tenderness: There is abdominal tenderness. There is no guarding or rebound.     Comments: Diffuse tenderness  Musculoskeletal:        General: Normal range of motion.     Cervical back: Neck supple.  Skin:    General: Skin is warm and dry.  Neurological:     General: No focal deficit present.     Mental Status: She is alert.     Comments: Speech is clear, able to follow commands CN III-XII intact Normal strength in upper and lower extremities bilaterally including dorsiflexion and plantar flexion, strong and equal grip strength Sensation grossly intact throughout Moves  extremities without ataxia, coordination intact No pronator drift  Psychiatric:        Mood and Affect: Mood normal.        Behavior: Behavior normal.     ED Results / Procedures / Treatments   Labs (all labs ordered are listed, but only abnormal results are displayed) Labs Reviewed  COMPREHENSIVE METABOLIC PANEL - Abnormal; Notable for the following components:      Result Value   Creatinine, Ser 1.04 (*)    AST 66 (*)    ALT 61 (*)    All other components within normal limits  CBC WITH DIFFERENTIAL/PLATELET  ETHANOL  RAPID URINE DRUG SCREEN, HOSP PERFORMED  LIPASE, BLOOD  CBG MONITORING, ED  I-STAT BETA HCG BLOOD, ED ([facility], [facility], [facility] ONLY)    EKG EKG Interpretation  Date/Time:  Saturday November 27 2021 12:31:35 EDT Ventricular Rate:  77 PR Interval:  220 QRS Duration: 86 QT Interval:  384 QTC Calculation: 435 R Axis:   89 Text Interpretation: Sinus rhythm Prolonged PR interval Nonspecific T abnrm, anterolateral leads ST elev, probable normal early repol pattern No significant change since last tracing Artifact Confirmed by Blanchie Dessert [phone]) on 11/27/2021 12:35:02 PM  Radiology No results found.  Procedures Procedures    Medications Ordered in ED Medications  ondansetron (ZOFRAN) injection 4 mg (4 mg Intravenous Given 11/27/21 1251)  acetaminophen (TYLENOL) tablet 650 mg (650 mg Oral Given 11/27/21 1405)  levETIRAcetam (KEPPRA) IVPB 1000 mg/100 mL premix (0 mg Intravenous Stopped 11/27/21 1426)  levETIRAcetam (KEPPRA) IVPB 1000 mg/100 mL premix (1,000 mg Intravenous New Bag/Given 11/27/21 1426)    ED Course/ Medical Decision Making/ A&P Clinical Course as of 11/27/21 1441  Sat Nov 27, 2021  1410 ALT(!): 61 [CA]  1410 AST(!): 66 [CA]    Clinical Course User Index [CA] Suzy Bouchard, PA-C                           Medical Decision Making Amount and/or Complexity of Data Reviewed Independent Historian: friend    Details: Friend provided  history External Data Reviewed: notes.    Details: Neurology notes Labs: ordered. Decision-making details documented in ED Course. ECG/medicine tests: ordered and independent interpretation performed. Decision-making details documented in ED Course.  Risk OTC drugs. Prescription drug management.   This patient presents to the ED for concern of seizure-like activity, this involves an extensive number of treatment options, and is a complaint that carries with it a high risk of complications and morbidity.  The differential diagnosis includes seizure, syncope, infection, etc  31 year old female presents to the ED due to possible seizure-like activity. friend at bedside witnessed  seizure-like activity.  Friend states patient had 2 episodes where her full body stiffened and patient began feeling at the mouth. Patient confused after each episode. Per chart review, it appears patient has had 2 previous episodes like this in January 2023 in 2010.  Patient states episodes typically occur when she is experiencing abdominal pain associated with nausea and vomiting. Possible prodromal symptoms?  Patient has been evaluated by neurology where she has had an unremarkable MRI and EEG.  Upon arrival, patient afebrile, not tachycardic or hypoxic.  Patient in no acute distress.  Patient appears drowsy during initial evaluation complaining of abdominal pain.  Abdomen soft, nondistended with diffuse tenderness. Labs ordered. IVFs given.  CBC unremarkable.  No leukocytosis and normal hemoglobin.  CMP significant for mild elevation in AST and ALT.  Elevated creatinine at 1.04.  No major electrolyte derangements.  Normal ethanol.  Negative pregnancy test. EKG demonstrates NSR.   2:41 PM Discussed with Dr. Cheral Marker who recommends loading with 2,000 Keppra and discharging with 500mg  BID with neurology follow-up. He recommends observing patient for 3 hours after keppra load.   2:47 PM reassessed patient at bedside.  Abdomen  soft, nondistended, nontender.  Low suspicion for acute abdomen.  Patient awake and answering all questions.   Patient handed off to [facility], PA-C at shift change pending observation after Keppra dose.        Final Clinical Impression(s) / ED Diagnoses Final diagnoses:  Seizure-like activity [facility])    Rx / DC Orders ED Discharge Orders          Ordered    levETIRAcetam (KEPPRA) 500 MG tablet  2 times daily        11/27/21 1351              Karie Kirks 11/27/21 1451    Blanchie Dessert, MD 11/28/21 1546

## 2021-11-27 NOTE — ED Notes (Signed)
Unable to do neuro checks. Pt refused to cooperate or answer questions at this time. Able to answer questions for provider. Unable to follow commands. }Pt also vomited x 1 episode on the floor. Provider notified. Will continue to monitor.

## 2021-11-29 ENCOUNTER — Encounter: Payer: Self-pay | Admitting: Neurology

## 2021-11-30 ENCOUNTER — Telehealth: Payer: Self-pay | Admitting: Neurology

## 2021-11-30 NOTE — Telephone Encounter (Signed)
Pt is calling. Stated she needs to make a appointment with Dr. April Manson

## 2021-11-30 NOTE — Telephone Encounter (Signed)
Please call and advise patient to continue with Keppra 500 mg twice daily and add her to my next available follow up. She already had an EEG and MRI. Thanks

## 2021-11-30 NOTE — Telephone Encounter (Signed)
Dr. April Manson received a similar mychart message and response was  Alric Ran, MD  You2 hours ago (12:21 PM)   [facility] Please call and advise patient to continue with Keppra 500 mg twice daily and add her to my next available follow up. She already had an EEG and MRI. Thanks     I will reach out to the pt on this via mychart.

## 2021-11-30 NOTE — Telephone Encounter (Signed)
Patient called in stated she went to the ED over the weekend for seizure like activity. Dr. Jabier Mutton next available to a f/u appt is not until 01/14/22-is this okay or does she need to be worked in sooner? Or anything we can recommend for her in the meantime? thank you I let patient know either one of Dr. Jabier Mutton nurses or I would be giving her  a call back, please advise

## 2021-11-30 NOTE — Telephone Encounter (Signed)
Pt accepted appt for tomorrow with Dr. April Manson at 115.

## 2021-12-01 ENCOUNTER — Encounter: Payer: Self-pay | Admitting: Neurology

## 2021-12-01 ENCOUNTER — Other Ambulatory Visit: Payer: Self-pay | Admitting: Neurology

## 2021-12-01 ENCOUNTER — Ambulatory Visit: Payer: Medicaid Other | Admitting: Neurology

## 2021-12-01 VITALS — BP 112/77 | HR 75 | Ht 67.0 in | Wt 150.0 lb

## 2021-12-01 DIAGNOSIS — G40001 Localization-related (focal) (partial) idiopathic epilepsy and epileptic syndromes with seizures of localized onset, not intractable, with status epilepticus: Secondary | ICD-10-CM | POA: Diagnosis not present

## 2021-12-01 DIAGNOSIS — G43719 Chronic migraine without aura, intractable, without status migrainosus: Secondary | ICD-10-CM

## 2021-12-01 DIAGNOSIS — Z5181 Encounter for therapeutic drug level monitoring: Secondary | ICD-10-CM

## 2021-12-01 MED ORDER — FOLIC ACID 1 MG PO TABS: 1.0000 mg | ORAL_TABLET | Freq: Every day | ORAL | 11 refills | Status: DC

## 2021-12-01 MED ORDER — LEVETIRACETAM 500 MG PO TABS: 500.0000 mg | ORAL_TABLET | Freq: Two times a day (BID) | ORAL | 11 refills | Status: DC

## 2021-12-01 MED ORDER — QULIPTA 60 MG PO TABS: 60.0000 mg | ORAL_TABLET | Freq: Every day | ORAL | 11 refills | Status: AC

## 2021-12-01 MED ORDER — VALTOCO 15 MG DOSE 7.5 MG/0.1ML NA LQPK: 15.0000 mg | NASAL | 2 refills | Status: AC | PRN

## 2021-12-01 NOTE — Progress Notes (Signed)
[facility]  PATIENT: Caitlin FavorsDominique L Amore-Erazo DOB: [birth year]  REQUESTING CLINICIAN: Barnie MortJackson, Kerra J, PA-C HISTORY FROM: Patient  REASON FOR VISIT: Seizure    HISTORICAL  CHIEF COMPLAINT:  Chief Complaint  Patient presents with   Follow-up    Rm 12. Accompanied by mom. Pt reports 2 seizure on Saturday, one at 4 am and one at 10 am. States they lasted 2-3 minutes each.   INTERVAL HISTORY 12/01/2021:  Caitlin Rhodes presents today for follow-up.  She is accompanied by her mother.  Last visit was in March for seizure-like activity.  At that time we completed an EEG which was normal and MRI brain which was unrevealing.  She continued to do well, until last weekend when she had 2 seizures after a night out.  Patient reports that night she had a few drinks, on her way home she had a seizure while in her car.  She was a passenger, her friend described her as stiffening with foaming at the mouth.  There was also report of urinary incontinence.  The next day around 10 AM she also had another seizure.  She reports at that time she felt nauseous was going to the bathroom to throw up and had a seizure.  Again stiffening with foaming at the mouth.  She was taken to the hospital loaded with Keppra and currently on Keppra 500 twice daily.  She reports tiredness and somnolence since starting the Keppra but no additional seizures. She is also complaining of slight headaches since the seizures.  In terms of the headaches, I did start her on Elavil, stated the medication was not helpful and she also had side effect from the medication therefore she discontinued.  She reports more than 15 headache days per month.   HISTORY OF PRESENT ILLNESS:  This is a 31 year old woman with past medical history of headaches, depression who is presenting for seizure versus syncope.  Patient states that on January 14, she woke up in the morning, not feeling good, reports the night before she was had a  headache.  She went to the bathroom trying to vomit and next thing that she know she is on the floor with her daughter calling her name.  She was initially confused, she noted that she urinated on herself and she called EMS.  She was taken to the ED, but left before all work-up completed.  Patient stated prior that to event, she had a similar event back in 2010.  She reported she was at home, again she was not feeling well, complaining of abdominal pain, feels like she wants to throw up, went to the bathroom and she was found on the floor by her husband, he noted that her body was all tensed up and she also urinated on herself.  Prior to 2010 she had an event in 2009 while in her gym class.  Again she was doing exercise, start feeling abdominal pain, she was going to sit down and passed out.  She has never seen a neurologist for the full work-up.  She was told by mother that between the age of 1 of 3 she did have frequent syncopal episode, she believes at that time she did have a EEG was told that everything was normal.  She denies any family history of seizures, and no other seizure risk factors identified She currently works at the school.  Patient also complains of headaches, increased frequency, currently she is having 3-4 headaches per week, and headaches can last for  days.  She is stating currently she had a headache that lasted for 4 days.  Her primary care doctor prescribed her sumatriptan but she has not started it.  She has never been on a preventive medication for headaches.  Handedness: Right handed   Onset: 2009  Seizure Type: unclear, possibly partial   Current frequency: Last episode Jan 2023, previous episode in 2010  Any injuries from seizures: None reported   Seizure risk factors: Bruises   Previous ASMs: None   Currenty ASMs: Levetiracetam 500 mg BID   ASMs side effects: Not applicable   Brain Images: Normal MRI   Previous EEGs: Normal EEG    OTHER MEDICAL CONDITIONS:  Headaches, depression.  REVIEW OF SYSTEMS: Full 14 system review of systems performed and negative with exception of: As noted in the HPI   ALLERGIES: No Known Allergies  HOME MEDICATIONS: Outpatient Medications Prior to Visit  Medication Sig Dispense Refill   fluconazole (DIFLUCAN) 150 MG tablet Take 1 tablet (150 mg total) by mouth every 3 (three) days as needed. 2 tablet 0   Probiotic Product (PROBIOTIC PO) Take by mouth.     amitriptyline (ELAVIL) 25 MG tablet Take 1 tablet (25 mg total) by mouth at bedtime. 30 tablet 3   levETIRAcetam (KEPPRA) 500 MG tablet Take 1 tablet (500 mg total) by mouth 2 (two) times daily. 60 tablet 0   No facility-administered medications prior to visit.    PAST MEDICAL HISTORY: Past Medical History:  Diagnosis Date   Seizures (HCC)     PAST SURGICAL HISTORY: Past Surgical History:  Procedure Laterality Date   BREAST ENHANCEMENT SURGERY  04/2021   NO PAST SURGERIES      FAMILY HISTORY: Family History  Problem Relation Age of Onset   Cancer Maternal Grandmother    Cancer Paternal Grandmother     SOCIAL HISTORY: Social History   Socioeconomic History   Marital status: Single    Spouse name: Not on file   Number of children: Not on file   Years of education: Not on file   Highest education level: Not on file  Occupational History   Not on file  Tobacco Use   Smoking status: Never   Smokeless tobacco: Never  Vaping Use   Vaping Use: Never used  Substance and Sexual Activity   Alcohol use: Yes   Drug use: No   Sexual activity: Yes    Partners: Male    Birth control/protection: None  Other Topics Concern   Not on file  Social History Narrative   Not on file   Social Determinants of Health   Financial Resource Strain: Not on file  Food Insecurity: Not on file  Transportation Needs: Not on file  Physical Activity: Not on file  Stress: Not on file  Social Connections: Not on file  Intimate Partner Violence: Not on file     PHYSICAL EXAM  GENERAL EXAM/CONSTITUTIONAL: Vitals:  Vitals:   12/01/21 1318  BP: 112/77  Pulse: 75  Weight: 150 lb (68 kg)  Height: 5\' 7"  (1.702 m)    Body mass index is 23.49 kg/m. Wt Readings from Last 3 Encounters:  12/01/21 150 lb (68 kg)  11/27/21 143 lb 15.4 oz (65.3 kg)  04/08/21 144 lb (65.3 kg)   Patient is in no distress; well developed, nourished and groomed; neck is supple  EYES: Pupils round and reactive to light, Visual fields full to confrontation, Extraocular movements intacts,  No results found.  MUSCULOSKELETAL: Gait, strength, tone,  movements noted in Neurologic exam below  NEUROLOGIC: MENTAL STATUS:      No data to display         awake, alert, oriented to person, place and time recent and remote memory intact normal attention and concentration language fluent, comprehension intact, naming intact fund of knowledge appropriate  CRANIAL NERVE:  2nd, 3rd, 4th, 6th - pupils equal and reactive to light, visual fields full to confrontation, extraocular muscles intact, no nystagmus 5th - facial sensation symmetric 7th - facial strength symmetric 8th - hearing intact 9th - palate elevates symmetrically, uvula midline 11th - shoulder shrug symmetric 12th - tongue protrusion midline  MOTOR:  normal bulk and tone, full strength in the BUE, BLE  SENSORY:  normal and symmetric to light touch  COORDINATION:  finger-nose-finger, fine finger movements normal  REFLEXES:  deep tendon reflexes present and symmetric  GAIT/STATION:  normal   DIAGNOSTIC DATA (LABS, IMAGING, TESTING) - I reviewed patient records, labs, notes, testing and imaging myself where available.  Lab Results  Component Value Date   WBC 9.4 11/27/2021   HGB 12.5 11/27/2021   HCT 39.7 11/27/2021   MCV 86.5 11/27/2021   PLT 290 11/27/2021      Component Value Date/Time   NA 139 11/27/2021 1230   K 3.9 11/27/2021 1230   CL 105 11/27/2021 1230   CO2 23  11/27/2021 1230   GLUCOSE 92 11/27/2021 1230   BUN 12 11/27/2021 1230   CREATININE 1.04 (H) 11/27/2021 1230   CREATININE 0.93 09/01/2012 1448   CALCIUM 9.2 11/27/2021 1230   PROT 7.4 11/27/2021 1230   ALBUMIN 4.2 11/27/2021 1230   AST 66 (H) 11/27/2021 1230   ALT 61 (H) 11/27/2021 1230   ALKPHOS 58 11/27/2021 1230   BILITOT 0.5 11/27/2021 1230   GFRNONAA >60 11/27/2021 1230   GFRAA >90 06/03/2013 0527   No results found for: "CHOL", "HDL", "LDLCALC", "LDLDIRECT", "TRIG" No results found for: "HGBA1C" No results found for: "VITAMINB12" Lab Results  Component Value Date   TSH 0.627 09/01/2012   MRI Brain 04/24/21 This is a normal MRI of the brain with and without contrast   Routine EEG 04/20/21 Normal   ASSESSMENT AND PLAN  31 y.o. year old female  with headaches and depression who is presenting for seizure follow up.  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  She is on Keppra 500 mg twice daily but reports somnolence and tiredness.  She has been on this medication less than a week, I have advised her to continue the medication because it is a safe medication but if she experiences any side effect including worsening tiredness and sleepiness or mood swing then we will switch the medication to either an extended release version or switch her to Briviact.  I will also prescribe her Valtoco to use at school and at home and also folic acid. In terms of the headaches, she does have chronic migraine headaches.  She had failed amitriptyline , therefore I will try her on the newer medication, Qulipta as preventive medication.  For abortive medication we will try sumatriptan first.  I will see her in 3 months for follow-up.  She voices understanding.    1. Localization-related idiopathic epilepsy and epileptic syndromes with seizures of localized onset, not intractable, with status epilepticus (HCC)   2. Intractable chronic migraine without aura and without  status migrainosus   3. Therapeutic drug monitoring      Patient Instructions  Continue with levetiracetam 500 mg twice daily, please update me if you do have side effect from the medication or if you have any breakthrough seizure Prescription for Valtoco for seizures lasting more than 2 minutes Folic acid 1 g take daily Qulipta for headache prevention and sumatriptan as abortive medication Follow-up in 3 months or sooner if worse   Per [facility] statutes, patients with seizures are not allowed to drive until they have been seizure-free for six months.  Other recommendations include using caution when using heavy equipment or power tools. Avoid working on ladders or at heights. Take showers instead of baths.  Do not swim alone.  Ensure the water temperature is not too high on the home water heater. Do not go swimming alone. Do not lock yourself in a room alone (i.e. bathroom). When caring for infants or small children, sit down when holding, feeding, or changing them to minimize risk of injury to the child in the event you have a seizure. Maintain good sleep hygiene. Avoid alcohol.  Also recommend adequate sleep, hydration, good diet and minimize stress.   During the Seizure  - First, ensure adequate ventilation and place patients on the floor on their left side  Loosen clothing around the neck and ensure the airway is patent. If the patient is clenching the teeth, do not force the mouth open with any object as this can cause severe damage - Remove all items from the surrounding that can be hazardous. The patient may be oblivious to what's happening and may not even know what he or she is doing. If the patient is confused and wandering, either gently guide him/her away and block access to outside areas - Reassure the individual and be comforting - Call 911. In most cases, the seizure ends before EMS arrives. However, there are cases when seizures may last over 3 to 5 minutes. Or the  individual may have developed breathing difficulties or severe injuries. If a pregnant patient or a person with diabetes develops a seizure, it is prudent to call an ambulance. - Finally, if the patient does not regain full consciousness, then call EMS. Most patients will remain confused for about 45 to 90 minutes after a seizure, so you must use judgment in calling for help. - Avoid restraints but make sure the patient is in a bed with padded side rails - Place the individual in a lateral position with the neck slightly flexed; this will help the saliva drain from the mouth and prevent the tongue from falling backward - Remove all nearby furniture and other hazards from the area - Provide verbal assurance as the individual is regaining consciousness - Provide the patient with privacy if possible - Call for help and start treatment as ordered by the caregiver   After the Seizure (Postictal Stage)  After a seizure, most patients experience confusion, fatigue, muscle pain and/or a headache. Thus, one should permit the individual to sleep. For the next few days, reassurance is essential. Being calm and helping reorient the person is also of importance.  Most seizures are painless and end spontaneously. Seizures are not harmful to others but can lead to complications such as stress on the lungs, brain and the heart. Individuals with prior lung problems may develop labored breathing and respiratory distress.     Orders Placed This Encounter  Procedures   Levetiracetam level    Meds ordered this encounter  Medications   levETIRAcetam (KEPPRA) 500 MG tablet    Sig: Take  1 tablet (500 mg total) by mouth 2 (two) times daily.    Dispense:  60 tablet    Refill:  11   folic acid (FOLVITE) 1 MG tablet    Sig: Take 1 tablet (1 mg total) by mouth daily.    Dispense:  30 tablet    Refill:  11   Atogepant (QULIPTA) 60 MG TABS    Sig: Take 60 mg by mouth daily.    Dispense:  30 tablet    Refill:  11    diazePAM, 15 MG Dose, (VALTOCO 15 MG DOSE) 2 x 7.5 MG/0.1ML LQPK    Sig: Place 15 mg into the nose as needed (For seizure lasting more than 2 minutes).    Dispense:  4 each    Refill:  2    Please provide 4 dose, 2 for school and 2 for home    Return in about 3 months (around 03/03/2022).    Windell Norfolk, MD 12/01/2021, 5:12 PM  [facility] [address], [address] [place], [place] [address] [phone]

## 2021-12-01 NOTE — Patient Instructions (Addendum)
Continue with levetiracetam 500 mg twice daily, please update me if you do have any side effects from the medication or if you have any breakthrough seizure Prescription for Valtoco for seizures lasting more than 2 minutes Daily folic acid  Qulipta for headache prevention and sumatriptan as abortive medication Follow-up in 3 months or sooner if worse

## 2021-12-03 LAB — LEVETIRACETAM LEVEL: Levetiracetam Lvl: 12.6 ug/mL (ref 10.0–40.0)

## 2021-12-27 ENCOUNTER — Encounter: Payer: Self-pay | Admitting: Neurology

## 2021-12-28 NOTE — Telephone Encounter (Signed)
PA for Bennie Pierini   (Key: [id number])  Your information has been sent to [employer].

## 2022-01-05 ENCOUNTER — Telehealth: Payer: Medicaid Other | Admitting: Emergency Medicine

## 2022-01-05 DIAGNOSIS — N760 Acute vaginitis: Secondary | ICD-10-CM | POA: Diagnosis not present

## 2022-01-05 MED ORDER — FLUCONAZOLE 150 MG PO TABS: 150.0000 mg | ORAL_TABLET | Freq: Once | ORAL | 0 refills | Status: AC

## 2022-01-05 MED ORDER — METRONIDAZOLE 500 MG PO TABS: 500.0000 mg | ORAL_TABLET | Freq: Two times a day (BID) | ORAL | 0 refills | Status: DC

## 2022-01-05 NOTE — Progress Notes (Signed)
Let's try the oral formulation.  I've sent 2 prescriptions to your pharmacy.  Take as directed.  E-Visit for Vaginal Symptoms  We are sorry that you are not feeling well. Here is how we plan to help! Based on what you shared with me it looks like you: May have a vaginosis due to bacteria  Vaginosis is an inflammation of the vagina that can result in discharge, itching and pain. The cause is usually a change in the normal balance of vaginal bacteria or an infection. Vaginosis can also result from reduced estrogen levels after menopause.  The most common causes of vaginosis are:   Bacterial vaginosis which results from an overgrowth of one on several organisms that are normally present in your vagina.   Yeast infections which are caused by a naturally occurring fungus called candida.   Vaginal atrophy (atrophic vaginosis) which results from the thinning of the vagina from reduced estrogen levels after menopause.   Trichomoniasis which is caused by a parasite and is commonly transmitted by sexual intercourse.  Factors that increase your risk of developing vaginosis include: Medications, such as antibiotics and steroids Uncontrolled diabetes Use of hygiene products such as bubble bath, vaginal spray or vaginal deodorant Douching Wearing damp or tight-fitting clothing Using an intrauterine device (IUD) for birth control Hormonal changes, such as those associated with pregnancy, birth control pills or menopause Sexual activity Having a sexually transmitted infection  Your treatment plan is Metronidazole or Flagyl 500mg  twice a day for 7 days.  I have electronically sent this prescription into the pharmacy that you have chosen.  Be sure to take all of the medication as directed. Stop taking any medication if you develop a rash, tongue swelling or shortness of breath. Mothers who are breast feeding should consider pumping and discarding their breast milk while on these antibiotics. However,  there is no consensus that infant exposure at these doses would be harmful.  Remember that medication creams can weaken latex condoms.   HOME CARE:  Good hygiene may prevent some types of vaginosis from recurring and may relieve some symptoms:  Avoid baths, hot tubs and whirlpool spas. Rinse soap from your outer genital area after a shower, and dry the area well to prevent irritation. Don't use scented or harsh soaps, such as those with deodorant or antibacterial action. Avoid irritants. These include scented tampons and pads. Wipe from front to back after using the toilet. Doing so avoids spreading fecal bacteria to your vagina.  Other things that may help prevent vaginosis include:  Don't douche. Your vagina doesn't require cleansing other than normal bathing. Repetitive douching disrupts the normal organisms that reside in the vagina and can actually increase your risk of vaginal infection. Douching won't clear up a vaginal infection. Use a latex condom. Both female and female latex condoms may help you avoid infections spread by sexual contact. Wear cotton underwear. Also wear pantyhose with a cotton crotch. If you feel comfortable without it, skip wearing underwear to bed. Yeast thrives in [place] Your symptoms should improve in the next day or two.  GET HELP RIGHT AWAY IF:  You have pain in your lower abdomen ( pelvic area or over your ovaries) You develop nausea or vomiting You develop a fever Your discharge changes or worsens You have persistent pain with intercourse You develop shortness of breath, a rapid pulse, or you faint.  These symptoms could be signs of problems or infections that need to be evaluated by a medical provider  now.  MAKE SURE YOU   Understand these instructions. Will watch your condition. Will get help right away if you are not doing well or get worse.  Thank you for choosing an e-visit.  Your e-visit answers were reviewed by a board  certified advanced clinical practitioner to complete your personal care plan. Depending upon the condition, your plan could have included both over the counter or prescription medications.  Please review your pharmacy choice. Make sure the pharmacy is open so you can pick up prescription now. If there is a problem, you may contact your provider through [employer] and have the prescription routed to another pharmacy.  Your safety is important to [place]. If you have drug allergies check your prescription carefully.   For the next 24 hours you can use MyChart to ask questions about today's visit, request a non-urgent call back, or ask for a work or school excuse. You will get an email in the next two days asking about your experience. I hope that your e-visit has been valuable and will speed your recovery.  Approximately 5 minutes was used in reviewing the patient's chart, questionnaire, prescribing medications, and documentation.

## 2022-01-05 NOTE — Progress Notes (Signed)
Message sent to patient requesting further input regarding current symptoms. Awaiting patient response.  

## 2022-01-06 ENCOUNTER — Encounter: Payer: Medicaid Other | Admitting: Obstetrics and Gynecology

## 2022-01-12 ENCOUNTER — Encounter: Payer: Self-pay | Admitting: Radiology

## 2022-01-12 ENCOUNTER — Other Ambulatory Visit (HOSPITAL_COMMUNITY)
Admission: RE | Admit: 2022-01-12 | Discharge: 2022-01-12 | Disposition: A | Discharge status: Home / Self Care | Payer: Medicaid Other | Source: Ambulatory Visit | Attending: Radiology | Admitting: Radiology

## 2022-01-12 ENCOUNTER — Ambulatory Visit (INDEPENDENT_AMBULATORY_CARE_PROVIDER_SITE_OTHER): Payer: Medicaid Other | Admitting: Radiology

## 2022-01-12 VITALS — BP 122/76 | Ht 67.0 in | Wt 151.0 lb

## 2022-01-12 DIAGNOSIS — N761 Subacute and chronic vaginitis: Secondary | ICD-10-CM | POA: Diagnosis not present

## 2022-01-12 DIAGNOSIS — Z01419 Encounter for gynecological examination (general) (routine) without abnormal findings: Secondary | ICD-10-CM

## 2022-01-12 DIAGNOSIS — Z113 Encounter for screening for infections with a predominantly sexual mode of transmission: Secondary | ICD-10-CM

## 2022-01-12 DIAGNOSIS — Z30018 Encounter for initial prescription of other contraceptives: Secondary | ICD-10-CM

## 2022-01-12 NOTE — Progress Notes (Signed)
AVE SCHARNHORST 08/10/1990 [id number]   History:  31 y.o. G5P4 presents for annual exam. Interested in another IUD for [facility] has had Mirena (too many hormones) Paragard (felt the strings). Recurrent BV treated monthly x 10 months. Has had the same partner, not using condoms.  Gynecologic History Patient's last menstrual period was 12/19/2021 (approximate). Period Cycle (Days): 28 Period Duration (Days): 6 Period Pattern: Regular Menstrual Flow: Light (light--heavy--light with clots) Menstrual Control: Tampon, Maxi pad Dysmenorrhea: (!) Severe (back cramping) Dysmenorrhea Symptoms: Cramping Contraception/Family planning: none Sexually active: yes Last Pap: 2020. Results were: normal   Obstetric History OB History  Gravida Para Term Preterm AB Living  5 4 4  0 1 4  SAB IAB Ectopic Multiple Live Births  1 0 0 0 4    # Outcome Date GA Lbr Len/2nd Weight Sex Delivery Anes PTL Lv  5 Term 07/04/18 [redacted]w[redacted]d 01:15 / 00:34 5 lb 9.1 oz (2.526 kg) F Vag-Spont EPI  LIV  4 SAB           3 Term      Vag-Spont     2 Term      Vag-Spont     1 Term      Vag-Spont        The following portions of the patient's history were reviewed and updated as appropriate: allergies, current medications, past family history, past medical history, past social history, past surgical history, and problem list.  Review of Systems Pertinent items noted in HPI and remainder of comprehensive ROS otherwise negative.   Past medical history, past surgical history, family history and social history were all reviewed and documented in the EPIC chart.   Exam:  Vitals:   01/12/22 1440  BP: 122/76  Weight: 151 lb (68.5 kg)  Height: 5\' 7"  (1.702 m)   Body mass index is 23.65 kg/m.  General appearance:  Normal Thyroid:  Symmetrical, normal in size, without palpable masses or nodularity. Respiratory  Auscultation:  Clear without wheezing or rhonchi Cardiovascular  Auscultation:  Regular rate, without  rubs, murmurs or gallops  Edema/varicosities:  Not grossly evident Abdominal  Soft,nontender, without masses, guarding or rebound.  Liver/spleen:  No organomegaly noted  Hernia:  None appreciated  Skin  Inspection:  Grossly normal Breasts: Examined lying and sitting.   Right: Without masses, retractions, nipple discharge or axillary adenopathy.   Left: Without masses, retractions, nipple discharge or axillary adenopathy. Genitourinary   Inguinal/mons:  Normal without inguinal adenopathy  External genitalia:  Normal appearing vulva with no masses, tenderness, or lesions  BUS/Urethra/Skene's glands:  Normal without masses or exudate  Vagina:  Normal appearing with normal color and discharge, no lesions  Cervix:  Normal appearing without discharge or lesions  Uterus:  Normal in size, shape and contour.  Mobile, nontender  Adnexa/parametria:     Rt: Normal in size, without masses or tenderness.   Lt: Normal in size, without masses or tenderness.  Anus and perineum: Normal   Patient informed chaperone available to be present for breast and pelvic exam. Patient has requested no chaperone to be present. Patient has been advised what will be completed during breast and pelvic exam.   Assessment/Plan:   1. Well woman exam with routine gynecological exam - Cytology - PAP( )  2. Encounter for initial prescription of other contraceptives Unsure if she wants [name] or Paragard, will decide the week before appt and let 01/14/22 know - IUD Insertion; Future  3. Chronic vaginitis  - Mycoplasma /  ureaplasma culture  4. Screening for STDs (sexually transmitted diseases) Done with pap     Discussed SBE, colonoscopy and DEXA screening as directed/appropriate. Recommend of exercise weekly, including weight bearing exercise. Encouraged the use of seatbelts and sunscreen. Return in 1 year for annual or as needed.   Arlie Solomons B WHNP-BC 4:00 PM 01/12/2022

## 2022-01-20 LAB — CYTOLOGY - PAP
Chlamydia: NEGATIVE
Comment: NEGATIVE
Comment: NEGATIVE
Comment: NEGATIVE
Comment: NORMAL
Diagnosis: UNDETERMINED — AB
High risk HPV: NEGATIVE
Neisseria Gonorrhea: NEGATIVE
Trichomonas: NEGATIVE

## 2022-01-21 ENCOUNTER — Ambulatory Visit (INDEPENDENT_AMBULATORY_CARE_PROVIDER_SITE_OTHER): Payer: Medicaid Other | Admitting: Radiology

## 2022-01-21 ENCOUNTER — Encounter: Payer: Self-pay | Admitting: Radiology

## 2022-01-21 VITALS — BP 110/64

## 2022-01-21 DIAGNOSIS — Z01812 Encounter for preprocedural laboratory examination: Secondary | ICD-10-CM

## 2022-01-21 DIAGNOSIS — Z30018 Encounter for initial prescription of other contraceptives: Secondary | ICD-10-CM

## 2022-01-21 LAB — MYCOPLASMA / UREAPLASMA CULTURE

## 2022-01-21 MED ORDER — LEVONORGESTREL 19.5 MG IU IUD: INTRAUTERINE_SYSTEM | Freq: Once | INTRAUTERINE | Status: AC

## 2022-01-21 NOTE — Progress Notes (Signed)
   Caitlin Rhodes 09-07-1990 [id number]   History:  31 y.o. G5P4 presents for insertion of [name] IUD.  Pt has been counseled about risks and benefits as well as complications.  Consent is obtained today.  Patient's last menstrual period was 01/15/2022 (exact date). GC/CT/Trich testing: negative  Past medical history, past surgical history, family history and social history were all reviewed and documented in the EPIC chart.  ROS:  A ROS was performed and pertinent positives and negatives are included.  Exam: Vitals:   01/21/22 1359  BP: 110/64   There is no height or weight on file to calculate BMI.  Pelvic exam: Vulva:  normal female genitalia Vagina:  normal vagina, no discharge, exudate, lesion, or erythema Cervix:  Non-tender, Negative CMT, no lesions or redness. Uterus:  normal shape, position and consistency    Procedure:  Speculum inserted.   Cervix visualized and cleansed with Betadine x 3.  Tenaculum placed on anterior cervix. Then uterus sounded to 8 cm. IUD inserted easily. Strings trimmed to 3 cm.  Minimal bleeding noted.  Pt tolerated the procedure well.  Chaperone present: Raynelle Fanning, CMA   Assessment/Plan:  Insertion of [name] IUD             UPT neg   Return for recheck 4-6 weeks Pt aware to call for any concerns Pt aware removal due no later than 01/22/2027, IUD card given to pt.   Caitlin Rhodes B WHNP-BC, 2:18 PM 01/21/2022

## 2022-01-25 ENCOUNTER — Telehealth: Payer: Self-pay

## 2022-01-25 ENCOUNTER — Other Ambulatory Visit: Payer: Self-pay | Admitting: Nurse Practitioner

## 2022-01-25 DIAGNOSIS — N760 Acute vaginitis: Secondary | ICD-10-CM

## 2022-01-25 MED ORDER — FLUCONAZOLE 150 MG PO TABS: 150.0000 mg | ORAL_TABLET | ORAL | 0 refills | Status: DC

## 2022-01-25 MED ORDER — METRONIDAZOLE 0.75 % VA GEL: VAGINAL | 0 refills | Status: DC

## 2022-01-25 MED ORDER — DOXYCYCLINE HYCLATE 100 MG PO CAPS: ORAL_CAPSULE | ORAL | 0 refills | Status: DC

## 2022-01-25 NOTE — Telephone Encounter (Signed)
Caitlin Frater, NP on 01/21/22 and test result return with positive mycoplasms and pos ureaplasma.  Patient was calling to see what she needs to do.  Also, patient reports she feels like BV sx returning. Recommend OV?

## 2022-01-25 NOTE — Telephone Encounter (Signed)
Please send in Doxycycline 100 mg BID x 14 days for treatment of ureaplasma and mycoplasma. Metrogel 0.75% nightly x 5 days for possible BV. Thanks.

## 2022-01-25 NOTE — Telephone Encounter (Signed)
Spoke with patient and informed her. She also asked if you would send in Rx for yeast infection because she feels the antibiotic for 2 weeks will cause one and would like to go ahead and have Rx on hand.

## 2022-01-25 NOTE — Telephone Encounter (Signed)
Yes I will! Thanks!

## 2022-01-30 ENCOUNTER — Encounter: Payer: Self-pay | Admitting: Neurology

## 2022-01-31 NOTE — Telephone Encounter (Signed)
Can we please follow up on the PA for Qulipta. Thanks

## 2022-02-07 NOTE — Telephone Encounter (Signed)
Please ask he if she is willing to try an injectable, if so we can try aimovig.

## 2022-02-11 ENCOUNTER — Ambulatory Visit: Payer: Medicaid Other | Admitting: Radiology

## 2022-02-11 NOTE — Telephone Encounter (Signed)
Please bring in at 1:45 for visit

## 2022-02-11 NOTE — Telephone Encounter (Signed)
Spoke with patient she complains of lower abdominal pain severity 6, with vaginal discharge,vaginal odor. Routing to Provider

## 2022-02-11 NOTE — Telephone Encounter (Signed)
Patient states she cannot be seen any earlier due to work/ field trip. Advised patient per Rubbie Battiest NP to be seen earlier with [place] due to severity of pain or go to urgent care asap. Patient understands recommendations. Patient states she might go to urgent care. Appointment  scheduled for Monday if patient decides not to seek medical attention over weekend.

## 2022-02-14 ENCOUNTER — Ambulatory Visit (INDEPENDENT_AMBULATORY_CARE_PROVIDER_SITE_OTHER): Payer: Medicaid Other | Admitting: Nurse Practitioner

## 2022-02-14 VITALS — BP 102/68 | HR 80 | Resp 16

## 2022-02-14 DIAGNOSIS — B9689 Other specified bacterial agents as the cause of diseases classified elsewhere: Secondary | ICD-10-CM

## 2022-02-14 DIAGNOSIS — R109 Unspecified abdominal pain: Secondary | ICD-10-CM | POA: Diagnosis not present

## 2022-02-14 DIAGNOSIS — N898 Other specified noninflammatory disorders of vagina: Secondary | ICD-10-CM

## 2022-02-14 DIAGNOSIS — Z30431 Encounter for routine checking of intrauterine contraceptive device: Secondary | ICD-10-CM | POA: Diagnosis not present

## 2022-02-14 DIAGNOSIS — N760 Acute vaginitis: Secondary | ICD-10-CM | POA: Diagnosis not present

## 2022-02-14 LAB — WET PREP FOR TRICH, YEAST, CLUE

## 2022-02-14 MED ORDER — METRONIDAZOLE 0.75 % VA GEL: 1.0000 | VAGINAL | 0 refills | Status: DC

## 2022-02-14 MED ORDER — FLUCONAZOLE 150 MG PO TABS: 150.0000 mg | ORAL_TABLET | ORAL | 0 refills | Status: DC

## 2022-02-14 MED ORDER — METRONIDAZOLE 500 MG PO TABS: 500.0000 mg | ORAL_TABLET | Freq: Two times a day (BID) | ORAL | 0 refills | Status: DC

## 2022-02-14 NOTE — Progress Notes (Signed)
   Acute Office Visit  Subjective:    Patient ID: Caitlin Rhodes, female    DOB: [birth year], 32 y.o.   MRN: [id number]   HPI 32 y.o. [id number] presents today for abdominal cramping, vaginal bleeding, discharge and odor. [name] inserted 01/21/2022. Menstrual-like bleeding started yesterday. Prior to that she had consistent spotting since insertion and occasional cramping. H/O recurrent BV. Was treated monthly x 10 months this past year. Treated for BV, mycoplasma and ureaplasma 01/21/22.    Review of Systems  Constitutional: Negative.   Genitourinary:  Positive for pelvic pain (Cramping), vaginal bleeding (Light) and vaginal discharge. Negative for dysuria, flank pain, frequency, genital sores, hematuria and urgency.       Vaginal odor       Objective:    Physical Exam Constitutional:      Appearance: Normal appearance.  Genitourinary:    General: Normal vulva.     Vagina: Normal.     Cervix: Normal.     Uterus: Normal.      Comments: IUD string visible ~3 cm Very scant amount of blood present    BP 102/68   Pulse 80   Resp 16   LMP 02/13/2022 (Exact Date)  Wt Readings from Last 3 Encounters:  01/12/22 151 lb (68.5 kg)  12/01/21 150 lb (68 kg)  11/27/21 143 lb 15.4 oz (65.3 kg)        Patient informed chaperone available to be present for breast and/or pelvic exam. Patient has requested no chaperone to be present. Patient has been advised what will be completed during breast and pelvic exam.   Wet prep + clue cells (+ odor)  Assessment & Plan:   Problem List Items Addressed This Visit   None Visit Diagnoses     Bacterial vaginosis    -  Primary   Relevant Medications   metroNIDAZOLE (FLAGYL) 500 MG tablet      metroNIDAZOLE (METROGEL) 0.75 % vaginal gel   IUD check up       Vaginal discharge       Relevant Orders   WET PREP FOR TRICH, YEAST, CLUE (Completed)   Abdominal cramping       Recurrent vaginitis       Relevant Medications    fluconazole (DIFLUCAN) 150 MG tablet   metroNIDAZOLE (METROGEL) 0.75 % vaginal gel      Plan: Wet prep positive for clue cells - Flagyl 500 mg BID x 7 days. Recommend suppressive treatment of Metrogel 0.75% twice weekly x 6 months beginning 1 week after completing treatment for current infection. Diflucan provided per request. Reassurance provided on IUD placement and normal bleeding patterns that can occur first 3-6 months. Ibuprofen as needed for cramping.      Tamela Gammon DNP, 9:39 AM 02/14/2022

## 2022-02-21 ENCOUNTER — Other Ambulatory Visit: Payer: Self-pay | Admitting: Nurse Practitioner

## 2022-02-21 ENCOUNTER — Other Ambulatory Visit: Payer: Self-pay | Admitting: Neurology

## 2022-02-21 ENCOUNTER — Encounter: Payer: Self-pay | Admitting: Nurse Practitioner

## 2022-02-21 DIAGNOSIS — B9689 Other specified bacterial agents as the cause of diseases classified elsewhere: Secondary | ICD-10-CM

## 2022-02-21 MED ORDER — AIMOVIG 140 MG/ML ~~LOC~~ SOAJ: 140.0000 mg | SUBCUTANEOUS | 11 refills | Status: DC

## 2022-02-21 MED ORDER — METRONIDAZOLE 500 MG PO TABS: 500.0000 mg | ORAL_TABLET | Freq: Two times a day (BID) | ORAL | 0 refills | Status: AC

## 2022-02-21 NOTE — Telephone Encounter (Signed)
Order for [place] completed.

## 2022-02-24 MED ORDER — SUMATRIPTAN SUCCINATE 50 MG PO TABS: 50.0000 mg | ORAL_TABLET | ORAL | 0 refills | Status: DC | PRN

## 2022-02-28 ENCOUNTER — Telehealth: Payer: Self-pay

## 2022-02-28 MED ORDER — AIMOVIG 140 MG/ML ~~LOC~~ SOAJ: 140.0000 mg | SUBCUTANEOUS | 0 refills | Status: DC

## 2022-02-28 NOTE — Telephone Encounter (Signed)
Could you guys see if a PA is needed for the aimovig? thanks

## 2022-02-28 NOTE — Addendum Note (Signed)
Addended by: Kristen Loader on: 02/28/2022 11:58 AM   Modules accepted: Orders

## 2022-03-01 NOTE — Telephone Encounter (Signed)
Submitted a Prior Authorization request to [facility] for  Aimovig  via CoverMyMeds. Will update once we receive a response.  Key: [id number]

## 2022-03-02 ENCOUNTER — Other Ambulatory Visit: Payer: Self-pay | Admitting: Neurology

## 2022-03-02 ENCOUNTER — Encounter: Payer: Self-pay | Admitting: Nurse Practitioner

## 2022-03-02 MED ORDER — PROPRANOLOL HCL 20 MG PO TABS: 20.0000 mg | ORAL_TABLET | Freq: Two times a day (BID) | ORAL | 3 refills | Status: DC

## 2022-03-02 NOTE — Telephone Encounter (Signed)
Lets try her on low dose beta blockers, propanolol. She is already on a ASM.

## 2022-03-02 NOTE — Telephone Encounter (Signed)
Received a fax regarding Prior Authorization from [facility] for  [place] . Authorization has been DENIED because patient did not meet plan criteria:    Notes show patient has tried Amitriptyline, but none of the others. Please advise.  Phone# [phone]

## 2022-03-02 NOTE — Progress Notes (Signed)
Qulipta, Aimovig not approved, will try her on Propanolol for headaches prevention.

## 2022-03-22 LAB — PREGNANCY, URINE: Preg Test, Ur: NEGATIVE

## 2022-03-23 ENCOUNTER — Other Ambulatory Visit: Payer: Self-pay | Admitting: Neurology

## 2022-03-28 ENCOUNTER — Encounter: Payer: Self-pay | Admitting: Neurology

## 2022-03-28 ENCOUNTER — Ambulatory Visit (INDEPENDENT_AMBULATORY_CARE_PROVIDER_SITE_OTHER): Payer: Medicaid Other | Admitting: Neurology

## 2022-03-28 VITALS — BP 127/86 | HR 75 | Ht 67.0 in | Wt 151.0 lb

## 2022-03-28 DIAGNOSIS — G43719 Chronic migraine without aura, intractable, without status migrainosus: Secondary | ICD-10-CM | POA: Diagnosis not present

## 2022-03-28 DIAGNOSIS — G40001 Localization-related (focal) (partial) idiopathic epilepsy and epileptic syndromes with seizures of localized onset, not intractable, with status epilepticus: Secondary | ICD-10-CM

## 2022-03-28 MED ORDER — LEVETIRACETAM ER 1000 MG PO TB24: 1000.0000 mg | ORAL_TABLET | Freq: Every evening | ORAL | 11 refills | Status: DC

## 2022-03-28 MED ORDER — AIMOVIG 140 MG/ML ~~LOC~~ SOAJ: 140.0000 mg | SUBCUTANEOUS | 11 refills | Status: DC

## 2022-03-28 NOTE — Patient Instructions (Signed)
Switch Keppra immediate release to Keppra extended release 1000 mg nightly She has failed amitriptyline, propranolol, will prescribe her Aimovig Continue with sumatriptan as needed for the headaches Follow-up in 3 months or sooner if worse

## 2022-03-28 NOTE — Progress Notes (Signed)
[facility]  PATIENT: Caitlin Rhodes DOB: 1990-03-27  REQUESTING CLINICIAN: Myrtie Neither, PA-C HISTORY FROM: Patient  REASON FOR VISIT: Seizure    HISTORICAL  CHIEF COMPLAINT:  Chief Complaint  Patient presents with   Follow-up    Rm 12, alone Reports no seizure, states she feels like she is on too many medications, states propranolol make her feel fatigue, pt stopped taking propranolol and keppra  2 weeks ago     INTERVAL HISTORY 03/28/2022:  Patient presents today for follow-up, last visit was in November, since then she has not had any seizures.  She is still struggling with her headaches.  She has failed Elavil, she has failed propranolol, made her very drowsy and even caused her crying spells.  Will try again to prescribe Aimovig.   In terms of the seizures, she also discontinued the Keppra 2 weeks ago because of fatigue and mood swings.    INTERVAL HISTORY 12/01/2021:  Caitlin Rhodes presents today for follow-up.  She is accompanied by her mother.  Last visit was in March for seizure-like activity.  At that time we completed an EEG which was normal and MRI brain which was unrevealing.  She continued to do well, until last weekend when she had 2 seizures after a night out.  Patient reports that night she had a few drinks, on her way home she had a seizure while in her car.  She was a passenger, her friend described her as stiffening with foaming at the mouth.  There was also report of urinary incontinence.  The next day around 10 AM she also had another seizure.  She reports at that time she felt nauseous was going to the bathroom to throw up and had a seizure.  Again stiffening with foaming at the mouth.  She was taken to the hospital loaded with Keppra and currently on Keppra 500 twice daily.  She reports tiredness and somnolence since starting the Keppra but no additional seizures. She is also complaining of slight headaches since the seizures.  In  terms of the headaches, I did start her on Elavil, stated the medication was not helpful and she also had side effect from the medication therefore she discontinued.  She reports more than 15 headache days per month.   HISTORY OF PRESENT ILLNESS:  This is a 32 year old woman with past medical history of headaches, depression who is presenting for seizure versus syncope.  Patient states that on January 14, she woke up in the morning, not feeling good, reports the night before she was had a headache.  She went to the bathroom trying to vomit and next thing that she know she is on the floor with her daughter calling her name.  She was initially confused, she noted that she urinated on herself and she called EMS.  She was taken to the ED, but left before all work-up completed.  Patient stated prior that to event, she had a similar event back in 2010.  She reported she was at home, again she was not feeling well, complaining of abdominal pain, feels like she wants to throw up, went to the bathroom and she was found on the floor by her husband, he noted that her body was all tensed up and she also urinated on herself.  Prior to 2010 she had an event in 2009 while in her gym class.  Again she was doing exercise, start feeling abdominal pain, she was going to sit down and passed out.  She has never seen a neurologist for the full work-up.  She was told by mother that between the age of 53 of 3 she did have frequent syncopal episode, she believes at that time she did have a EEG was told that everything was normal.  She denies any family history of seizures, and no other seizure risk factors identified She currently works at the school (middle school) Patient also complains of headaches, increased frequency, currently she is having 3-4 headaches per week, and headaches can last for days.  She is stating currently she had a headache that lasted for 4 days.  Her primary care doctor prescribed her sumatriptan but she has  not started it.  She has never been on a preventive medication for headaches.  Handedness: Right handed   Onset: 2009  Seizure Type: unclear, possibly partial   Current frequency: Last episode Jan 2023, previous episode in 2010  Any injuries from seizures: None reported   Seizure risk factors: Bruises   Previous ASMs: None   Currenty ASMs: Levetiracetam 500 mg BID   ASMs side effects: Not applicable   Brain Images: Normal MRI   Previous EEGs: Normal EEG    OTHER MEDICAL CONDITIONS: Headaches, depression.  REVIEW OF SYSTEMS: Full 14 system review of systems performed and negative with exception of: As noted in the HPI   ALLERGIES: No Known Allergies  HOME MEDICATIONS: Outpatient Medications Prior to Visit  Medication Sig Dispense Refill   diazePAM, 15 MG Dose, (VALTOCO 15 MG DOSE) 2 x 7.5 MG/0.1ML LQPK Place 15 mg into the nose as needed (For seizure lasting more than 2 minutes). 4 each 2   fluconazole (DIFLUCAN) 150 MG tablet Take 1 tablet (150 mg total) by mouth every 3 (three) days. 2 tablet 0   folic acid (FOLVITE) 1 MG tablet Take 1 tablet (1 mg total) by mouth daily. 30 tablet 11   metroNIDAZOLE (METROGEL) 0.75 % vaginal gel Place 1 Applicatorful vaginally 2 (two) times a week for 48 doses. Begin 1 week after completing Flagyl course 480 g 0   Probiotic Product (PROBIOTIC PO) Take by mouth.     SUMAtriptan (IMITREX) 50 MG tablet TAKE 1 TABLET (50 MG TOTAL) BY MOUTH EVERY 2 (TWO) HOURS AS NEEDED FOR MIGRAINE. MAY REPEAT IN 2 HOURS IF HEADACHE PERSISTS OR RECURS. NO MORE THAN TWO TABLETS IN 24 HOURS 10 tablet 0   Erenumab-aooe (AIMOVIG) 140 MG/ML SOAJ Inject 140 mg into the skin every 30 (thirty) days. 1.12 mL 11   Erenumab-aooe (AIMOVIG) 140 MG/ML SOAJ Inject 140 mg into the skin every 28 (twenty-eight) days. 1.12 mL 0   levETIRAcetam (KEPPRA) 500 MG tablet Take 1 tablet (500 mg total) by mouth 2 (two) times daily. 60 tablet 11   propranolol (INDERAL) 20 MG tablet Take  1 tablet (20 mg total) by mouth 2 (two) times daily. 60 tablet 3   Facility-Administered Medications Prior to Visit  Medication Dose Route Frequency Provider Last Rate Last Admin   levonorgestrel ([name]) 19.5 MG IUD   Intrauterine Once Chrzanowski, Jami B, NP        PAST MEDICAL HISTORY: Past Medical History:  Diagnosis Date   Migraine    Seizures ([place])     PAST SURGICAL HISTORY: Past Surgical History:  Procedure Laterality Date   BREAST ENHANCEMENT SURGERY  04/2021   NO PAST SURGERIES      FAMILY HISTORY: Family History  Problem Relation Age of Onset   Cancer Maternal Grandmother    Cancer  Paternal Grandmother     SOCIAL HISTORY: Social History   Socioeconomic History   Marital status: Single    Spouse name: Not on file   Number of children: Not on file   Years of education: Not on file   Highest education level: Not on file  Occupational History   Not on file  Tobacco Use   Smoking status: Never    Passive exposure: Never   Smokeless tobacco: Never  Vaping Use   Vaping Use: Never used  Substance and Sexual Activity   Alcohol use: Yes    Comment: rare   Drug use: No   Sexual activity: Yes    Partners: Male    Birth control/protection: Condom    Comment: menarche 31yo, sexual debut 32yo  Other Topics Concern   Not on file  Social History Narrative   Not on file   Social Determinants of Health   Financial Resource Strain: Not on file  Food Insecurity: Not on file  Transportation Needs: Not on file  Physical Activity: Not on file  Stress: Not on file  Social Connections: Not on file  Intimate Partner Violence: Not on file    PHYSICAL EXAM  GENERAL EXAM/CONSTITUTIONAL: Vitals:  Vitals:   03/28/22 1554  BP: 127/86  Pulse: 75  Weight: 151 lb (68.5 kg)  Height: '5\' 7"'$  (1.702 m)     Body mass index is 23.65 kg/m. Wt Readings from Last 3 Encounters:  03/28/22 151 lb (68.5 kg)  01/12/22 151 lb (68.5 kg)  12/01/21 150 lb (68 kg)    Patient is in no distress; well developed, nourished and groomed; neck is supple  EYES: Visual fields full to confrontation, Extraocular movements intacts,  No results found.  MUSCULOSKELETAL: Gait, strength, tone, movements noted in Neurologic exam below  NEUROLOGIC: MENTAL STATUS:      No data to display         awake, alert, oriented to person, place and time recent and remote memory intact normal attention and concentration language fluent, comprehension intact, naming intact fund of knowledge appropriate  CRANIAL NERVE:  2nd, 3rd, 4th, 6th - visual fields full to confrontation, extraocular muscles intact, no nystagmus 5th - facial sensation symmetric 7th - facial strength symmetric 8th - hearing intact 9th - palate elevates symmetrically, uvula midline 11th - shoulder shrug symmetric 12th - tongue protrusion midline  MOTOR:  normal bulk and tone, full strength in the BUE, BLE  SENSORY:  normal and symmetric to light touch  COORDINATION:  finger-nose-finger, fine finger movements normal  REFLEXES:  deep tendon reflexes present and symmetric  GAIT/STATION:  normal   DIAGNOSTIC DATA (LABS, IMAGING, TESTING) - I reviewed patient records, labs, notes, testing and imaging myself where available.  Lab Results  Component Value Date   WBC 9.4 11/27/2021   HGB 12.5 11/27/2021   HCT 39.7 11/27/2021   MCV 86.5 11/27/2021   PLT 290 11/27/2021      Component Value Date/Time   NA 139 11/27/2021 1230   K 3.9 11/27/2021 1230   CL 105 11/27/2021 1230   CO2 23 11/27/2021 1230   GLUCOSE 92 11/27/2021 1230   BUN 12 11/27/2021 1230   CREATININE 1.04 (H) 11/27/2021 1230   CREATININE 0.93 09/01/2012 1448   CALCIUM 9.2 11/27/2021 1230   PROT 7.4 11/27/2021 1230   ALBUMIN 4.2 11/27/2021 1230   AST 66 (H) 11/27/2021 1230   ALT 61 (H) 11/27/2021 1230   ALKPHOS 58 11/27/2021 1230   BILITOT 0.5  11/27/2021 1230   GFRNONAA >60 11/27/2021 1230   GFRAA >90  06/03/2013 0527   No results found for: "CHOL", "HDL", "LDLCALC", "LDLDIRECT", "TRIG" No results found for: "HGBA1C" No results found for: "VITAMINB12" Lab Results  Component Value Date   TSH 0.627 09/01/2012   MRI Brain 04/24/21 This is a normal MRI of the brain with and without contrast   Routine EEG 04/20/21 Normal   ASSESSMENT AND PLAN  32 y.o. year old female  with seizure disorder, headaches and depression who is presenting for seizure follow up.  In terms of the seizures, we will try her on extended release levetiracetam since she did have side effect of immediate release levetiracetam.  While we wait for approval, I did give a sample of Briviact.  Patient understand when she get the new prescription.  The Briviact and start extended release levetiracetam 1000 mg nightly. In terms of the headaches, she had fell Elavil, propranolol, at this time I am going to request Aimovig.  I provided her with Aimovig samples. Advised her to continue with the sumatriptan as needed.  I will see her in 3 months for follow-up    1. Localization-related idiopathic epilepsy and epileptic syndromes with seizures of localized onset, not intractable, with status epilepticus ([place])   2. Intractable chronic migraine without aura and without status migrainosus      Patient Instructions  Switch Keppra immediate release to Keppra extended release 1000 mg nightly She has failed amitriptyline, propranolol, will prescribe her Aimovig Continue with sumatriptan as needed for the headaches Follow-up in 3 months or sooner if worse    Per [facility] statutes, patients with seizures are not allowed to drive until they have been seizure-free for six months.  Other recommendations include using caution when using heavy equipment or power tools. Avoid working on ladders or at heights. Take showers instead of baths.  Do not swim alone.  Ensure the water temperature is not too high on the home water heater. Do  not go swimming alone. Do not lock yourself in a room alone (i.e. bathroom). When caring for infants or small children, sit down when holding, feeding, or changing them to minimize risk of injury to the child in the event you have a seizure. Maintain good sleep hygiene. Avoid alcohol.  Also recommend adequate sleep, hydration, good diet and minimize stress.   During the Seizure  - First, ensure adequate ventilation and place patients on the floor on their left side  Loosen clothing around the neck and ensure the airway is patent. If the patient is clenching the teeth, do not force the mouth open with any object as this can cause severe damage - Remove all items from the surrounding that can be hazardous. The patient may be oblivious to what's happening and may not even know what he or she is doing. If the patient is confused and wandering, either gently guide him/her away and block access to outside areas - Reassure the individual and be comforting - Call 911. In most cases, the seizure ends before EMS arrives. However, there are cases when seizures may last over 3 to 5 minutes. Or the individual may have developed breathing difficulties or severe injuries. If a pregnant patient or a person with diabetes develops a seizure, it is prudent to call an ambulance. - Finally, if the patient does not regain full consciousness, then call EMS. Most patients will remain confused for about 45 to 90 minutes after a seizure, so you must  use judgment in calling for help. - Avoid restraints but make sure the patient is in a bed with padded side rails - Place the individual in a lateral position with the neck slightly flexed; this will help the saliva drain from the mouth and prevent the tongue from falling backward - Remove all nearby furniture and other hazards from the area - Provide verbal assurance as the individual is regaining consciousness - Provide the patient with privacy if possible - Call for help and  start treatment as ordered by the caregiver   After the Seizure (Postictal Stage)  After a seizure, most patients experience confusion, fatigue, muscle pain and/or a headache. Thus, one should permit the individual to sleep. For the next few days, reassurance is essential. Being calm and helping reorient the person is also of importance.  Most seizures are painless and end spontaneously. Seizures are not harmful to others but can lead to complications such as stress on the lungs, brain and the heart. Individuals with prior lung problems may develop labored breathing and respiratory distress.     No orders of the defined types were placed in this encounter.   Meds ordered this encounter  Medications   levETIRAcetam ER 1000 MG TB24    Sig: Take 1,000 mg by mouth at bedtime.    Dispense:  30 tablet    Refill:  11   Erenumab-aooe (AIMOVIG) 140 MG/ML SOAJ    Sig: Inject 140 mg into the skin every 30 (thirty) days.    Dispense:  1.12 mL    Refill:  11    Return in about 3 months (around 06/28/2022).    Alric Ran, MD 03/28/2022, 4:19 PM  [facility] [address] [place], [place] [address] [phone]

## 2022-04-20 ENCOUNTER — Ambulatory Visit (INDEPENDENT_AMBULATORY_CARE_PROVIDER_SITE_OTHER): Payer: Medicaid Other | Admitting: Nurse Practitioner

## 2022-04-20 ENCOUNTER — Encounter: Payer: Self-pay | Admitting: Nurse Practitioner

## 2022-04-20 VITALS — BP 100/62 | Temp 98.0°F | Ht 67.0 in | Wt 152.0 lb

## 2022-04-20 DIAGNOSIS — G40909 Epilepsy, unspecified, not intractable, without status epilepticus: Secondary | ICD-10-CM

## 2022-04-20 DIAGNOSIS — R413 Other amnesia: Secondary | ICD-10-CM | POA: Diagnosis not present

## 2022-04-20 DIAGNOSIS — R41840 Attention and concentration deficit: Secondary | ICD-10-CM

## 2022-04-20 DIAGNOSIS — Z1159 Encounter for screening for other viral diseases: Secondary | ICD-10-CM | POA: Diagnosis not present

## 2022-04-20 DIAGNOSIS — Z79899 Other long term (current) drug therapy: Secondary | ICD-10-CM

## 2022-04-20 NOTE — Progress Notes (Signed)
I,Sheena H Holbrook,acting as a Neurosurgeon for Arnette Felts, FNP.,have documented all relevant documentation on the behalf of Arnette Felts, FNP,as directed by  Arnette Felts, FNP while in the presence of Arnette Felts, FNP.   Subjective:     Patient ID: Caitlin Rhodes , female    DOB: 06-17-1990 , 32 y.o.   MRN: [id number]   Chief Complaint  Patient presents with   Establish Care    HPI  Patient presents today to establish care. She was being seen at [facility] approximately one year ago. Patient has not had a PCP in about a year. She works a Runner, broadcasting/film/video. She has 4 children, divorced.   PMH - migraines - 2-3 times a month. She is followed by Neurology.   Mother - seen for lump but not sure about breast cancer.  Children - 3 boys (13, 12, and 10) and one girl (3)  Patient would like referral for possible ADHD testing. She is impulsive will forget multiple things. She has 3-4 calendars and still does not complete the task. She has noticed these symptoms about 3 years ago. She reports it will affect her at work. 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 She reports becoming bored easily. She feels like she does her best work done when she is under pressure. She will try to find ways to manage her time with timer and sticky note. She has not previously been diagnosed with anything similar.   Patient declines COVID vaccines at this time.         Past Medical History:  Diagnosis Date   Migraine    Seizures (HCC)      Family History  Problem Relation Age of Onset   Cancer Maternal Grandmother    Cancer Paternal Grandmother      Current Outpatient Medications:    diazePAM, 15 MG Dose, (VALTOCO 15 MG DOSE) 2 x 7.5 MG/0.1ML LQPK, Place 15 mg into the nose as needed (For seizure lasting more than 2 minutes)., Disp: 4 each, Rfl: 2   Erenumab-aooe (AIMOVIG) 140 MG/ML SOAJ, Inject 140 mg into the skin every 30 (thirty) days., Disp: 1.12 mL, Rfl: 11   folic acid (FOLVITE) 1 MG tablet,  Take 1 tablet (1 mg total) by mouth daily., Disp: 30 tablet, Rfl: 11   levETIRAcetam (KEPPRA XR) 500 MG 24 hr tablet, Take 1,000 mg by mouth at bedtime., Disp: , Rfl:    Probiotic Product (PROBIOTIC PO), Take by mouth., Disp: , Rfl:    SUMAtriptan (IMITREX) 50 MG tablet, TAKE 1 TABLET (50 MG TOTAL) BY MOUTH EVERY 2 (TWO) HOURS AS NEEDED FOR MIGRAINE. MAY REPEAT IN 2 HOURS IF HEADACHE PERSISTS OR RECURS. NO MORE THAN TWO TABLETS IN 24 HOURS, Disp: 10 tablet, Rfl: 0   Vitamin D, Ergocalciferol, (DRISDOL) 1.25 MG (50000 UNIT) CAPS capsule, Take 1 capsule (50,000 Units total) by mouth every 7 (seven) days., Disp: 12 capsule, Rfl: 1  Current Facility-Administered Medications:    levonorgestrel ([name]) 19.5 MG IUD, , Intrauterine, Once, Chrzanowski, Jami B, NP   No Known Allergies    Review of Systems  Constitutional: Negative.  Negative for activity change and fatigue.  Eyes:  Negative for visual disturbance.  Respiratory: Negative.  Negative for choking, shortness of breath and wheezing.   Cardiovascular: Negative.  Negative for chest pain, palpitations and leg swelling.  Gastrointestinal: Negative.   Endocrine: Negative.  Negative for polydipsia, polyphagia and polyuria.  Musculoskeletal: Negative.   Skin: Negative.   Neurological:  Negative for dizziness, weakness and headaches.  Psychiatric/Behavioral:  Negative for confusion. The patient is not nervous/anxious.      Today's Vitals   04/20/22 1511  BP: 100/62  Temp: 98 F (36.7 C)  TempSrc: Oral  Weight: 152 lb (68.9 kg)  Height: 5\' 7"  (1.702 m)   Body mass index is 23.81 kg/m.   Objective:  Physical Exam Vitals reviewed.  Constitutional:      Appearance: She is well-developed.  HENT:     Head: Normocephalic and atraumatic.  Eyes:     Pupils: Pupils are equal, round, and reactive to light.  Cardiovascular:     Rate and Rhythm: Normal rate and regular rhythm.     Pulses: Normal pulses.     Heart sounds: Normal heart  sounds. No murmur heard. Pulmonary:     Effort: Pulmonary effort is normal. No respiratory distress.     Breath sounds: Normal breath sounds. No wheezing.  Musculoskeletal:        General: Normal range of motion.  Skin:    General: Skin is warm and dry.     Capillary Refill: Capillary refill takes less than 2 seconds.  Neurological:     General: No focal deficit present.     Mental Status: She is alert and oriented to person, place, and time.     Cranial Nerves: No cranial nerve deficit.  Psychiatric:        Mood and Affect: Mood normal.         Assessment And Plan:    1. Concentration deficit Comments: Will refer to psychiatry to evaluate for ADHD - VITAMIN D 25 Hydroxy (Vit-D Deficiency, Fractures) - Vitamin B12 - TSH - CBC - Ambulatory referral to Psychiatry  2. Seizure disorder [facility](HCC) Comments: Continue f/u with Neurology  3. Memory changes Comments: I will check for any metabolic causes, unsure if related to her concentration issues. - VITAMIN D 25 Hydroxy (Vit-D Deficiency, Fractures) - Vitamin B12 - TSH - CBC - Ambulatory referral to Psychiatry  4. Other long term (current) drug therapy - CMP14+EGFR  5. Encounter for hepatitis C screening test for low risk patient Will check Hepatitis C screening due to recent recommendations to screen all adults 18 years and older - Hepatitis C antibody     Patient was given opportunity to ask questions. Patient verbalized understanding of the plan and was able to repeat key elements of the plan. All questions were answered to their satisfaction.   Arnette FeltsJanece Damond Borchers, FNP   I, Arnette FeltsJanece Jevin Camino, FNP, have reviewed all documentation for this visit. The documentation on 04/20/22 for the exam, diagnosis, procedures, and orders are all accurate and complete.   THE PATIENT IS ENCOURAGED TO PRACTICE SOCIAL DISTANCING DUE TO THE COVID-19 PANDEMIC.

## 2022-04-21 LAB — CBC
Hematocrit: 46.7 % — ABNORMAL HIGH (ref 34.0–46.6)
Hemoglobin: 14.7 g/dL (ref 11.1–15.9)
MCH: 27 pg (ref 26.6–33.0)
MCHC: 31.5 g/dL (ref 31.5–35.7)
MCV: 86 fL (ref 79–97)
Platelets: 326 10*3/uL (ref 150–450)
RBC: 5.45 x10E6/uL — ABNORMAL HIGH (ref 3.77–5.28)
RDW: 14.5 % (ref 11.7–15.4)
WBC: 5.8 10*3/uL (ref 3.4–10.8)

## 2022-04-21 LAB — CMP14+EGFR
ALT: 18 IU/L (ref 0–32)
AST: 20 IU/L (ref 0–40)
Albumin/Globulin Ratio: 1.4 (ref 1.2–2.2)
Albumin: 4.8 g/dL (ref 3.9–4.9)
Alkaline Phosphatase: 90 IU/L (ref 44–121)
BUN/Creatinine Ratio: 10 (ref 9–23)
BUN: 13 mg/dL (ref 6–20)
Bilirubin Total: <0.2 mg/dL (ref 0.0–1.2)
CO2: 23 mmol/L (ref 20–29)
Calcium: 9.8 mg/dL (ref 8.7–10.2)
Chloride: 102 mmol/L (ref 96–106)
Creatinine, Ser: 1.25 mg/dL — ABNORMAL HIGH (ref 0.57–1.00)
Globulin, Total: 3.4 g/dL (ref 1.5–4.5)
Glucose: 74 mg/dL (ref 70–99)
Potassium: 4.6 mmol/L (ref 3.5–5.2)
Sodium: 141 mmol/L (ref 134–144)
Total Protein: 8.2 g/dL (ref 6.0–8.5)
eGFR: 58 mL/min/{1.73_m2} — ABNORMAL LOW (ref >59)

## 2022-04-21 LAB — TSH: TSH: 1.25 u[IU]/mL (ref 0.450–4.500)

## 2022-04-21 LAB — HEPATITIS C ANTIBODY: Hep C Virus Ab: NONREACTIVE

## 2022-04-21 LAB — VITAMIN B12: Vitamin B-12: 1480 pg/mL — ABNORMAL HIGH (ref 232–1245)

## 2022-04-21 LAB — VITAMIN D 25 HYDROXY (VIT D DEFICIENCY, FRACTURES): Vit D, 25-Hydroxy: 25.9 ng/mL — ABNORMAL LOW (ref 30.0–100.0)

## 2022-04-26 ENCOUNTER — Encounter: Payer: Self-pay | Admitting: Neurology

## 2022-04-28 ENCOUNTER — Encounter: Payer: Self-pay | Admitting: Nurse Practitioner

## 2022-04-28 ENCOUNTER — Other Ambulatory Visit: Payer: Self-pay | Admitting: Nurse Practitioner

## 2022-04-28 MED ORDER — VITAMIN D (ERGOCALCIFEROL) 1.25 MG (50000 UNIT) PO CAPS: 50000.0000 [IU] | ORAL_CAPSULE | ORAL | 1 refills | Status: DC

## 2022-04-28 NOTE — Telephone Encounter (Signed)
Labs resulted on 04/21/22 but no result notes attached.   Please review, thanks!

## 2022-05-17 NOTE — Telephone Encounter (Signed)
Pt called to follow-up on PA for Amivog.

## 2022-05-18 NOTE — Telephone Encounter (Signed)
Patient has tried and failed amitriptyline and propranolol

## 2022-05-18 NOTE — Telephone Encounter (Signed)
Norris Cross (Key: [id number]) PA sent for Aimovig Status is pending

## 2022-05-24 ENCOUNTER — Other Ambulatory Visit: Payer: Self-pay | Admitting: Nurse Practitioner

## 2022-05-24 DIAGNOSIS — N760 Acute vaginitis: Secondary | ICD-10-CM

## 2022-05-25 NOTE — Telephone Encounter (Signed)
RF request received for Diflucan.  MyChart message sent to patient to let us know what type of symptoms she is having.

## 2022-05-25 NOTE — Telephone Encounter (Signed)
Patient complains of thick white vaginal discharge with itching and states she has been on antibiotics recently.  Please advise if ok for RF on Diflucan.

## 2022-06-08 ENCOUNTER — Other Ambulatory Visit: Payer: Self-pay | Admitting: Neurology

## 2022-06-08 NOTE — Telephone Encounter (Signed)
Changed instructions to be more clear. Please approve.

## 2022-06-16 ENCOUNTER — Encounter: Payer: Self-pay | Admitting: Neurology

## 2022-06-16 NOTE — Telephone Encounter (Signed)
They are both good. She should follow the recommendation of the treating psychiatrist.

## 2022-07-08 ENCOUNTER — Encounter: Payer: Self-pay | Admitting: Neurology

## 2022-07-11 ENCOUNTER — Telehealth (INDEPENDENT_AMBULATORY_CARE_PROVIDER_SITE_OTHER): Payer: Medicaid Other | Admitting: Neurology

## 2022-07-11 ENCOUNTER — Encounter: Payer: Self-pay | Admitting: Neurology

## 2022-07-11 DIAGNOSIS — G43709 Chronic migraine without aura, not intractable, without status migrainosus: Secondary | ICD-10-CM | POA: Diagnosis not present

## 2022-07-11 DIAGNOSIS — G40001 Localization-related (focal) (partial) idiopathic epilepsy and epileptic syndromes with seizures of localized onset, not intractable, with status epilepticus: Secondary | ICD-10-CM | POA: Diagnosis not present
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
## 2022-07-11 NOTE — Patient Instructions (Signed)
Continue with levetiracetam extended release 500 mg twice daily Continue with Aimovig monthly for headache prevention Continue with sumatriptan and ibuprofen as needed for breakthrough headaches Follow-up in 6 months or sooner if worse.

## 2022-07-18 ENCOUNTER — Telehealth: Payer: Medicaid Other | Admitting: Neurology

## 2022-07-28 ENCOUNTER — Encounter: Payer: Self-pay | Admitting: Neurology

## 2022-08-01 MED ORDER — AIMOVIG 140 MG/ML ~~LOC~~ SOAJ: 140.0000 mg | SUBCUTANEOUS | 11 refills | Status: DC

## 2022-08-14 ENCOUNTER — Encounter: Payer: Self-pay | Admitting: Neurology

## 2022-08-17 ENCOUNTER — Telehealth: Payer: Self-pay | Admitting: Neurology

## 2022-08-17 NOTE — Telephone Encounter (Signed)
Pt stated she went to Urgent Care for them to give her aimovig shot, stated the nurse didn't leave shot in long enough and medication shot all over the place. Pt wants to know if she need to do another shot. Requesting a call back from nurse.

## 2022-08-17 NOTE — Telephone Encounter (Signed)
I have spoken with Caitlin Rhodes verbally and she stated that she has contacted the rep and has things moving to get more samples, I have updated the patient and relayed to let us know if she experiences any symptoms while we wait for shipment. Please see phone note from 07.24.24 for [facility] message

## 2022-08-22 ENCOUNTER — Ambulatory Visit: Payer: Medicaid Other | Admitting: Nurse Practitioner

## 2022-09-06 ENCOUNTER — Telehealth: Payer: Self-pay

## 2022-09-06 ENCOUNTER — Other Ambulatory Visit (HOSPITAL_COMMUNITY): Payer: Self-pay

## 2022-09-06 NOTE — Telephone Encounter (Signed)
Pharmacy Patient Advocate Encounter  Received notification from [facility] MEDICAID that Prior Authorization for Aimovig 140MG /ML auto-injectors has been APPROVED from 09/06/2022 to 09/06/2023.   PA #/Case ID/Reference #: PA Case ID #: [id number]

## 2022-09-06 NOTE — Telephone Encounter (Signed)
Pharmacy Patient Advocate Encounter   Received notification from Patient Advice Request messages that prior authorization for Aimovig 140MG /ML auto-injectors is required/requested.   Insurance verification completed.   The patient is insured through [facility] MEDICAID .   Per test claim: PA required; PA submitted to [facility] MEDICAID via CoverMyMeds Key/confirmation #/EOC [id number] Status is pending

## 2022-09-08 ENCOUNTER — Telehealth: Payer: Self-pay | Admitting: Neurology
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
## 2022-10-29 ENCOUNTER — Telehealth: Payer: Medicaid Other | Admitting: Family Medicine

## 2022-10-29 DIAGNOSIS — N760 Acute vaginitis: Secondary | ICD-10-CM | POA: Diagnosis not present

## 2022-10-29 MED ORDER — METRONIDAZOLE 500 MG PO TABS
500.0000 mg | ORAL_TABLET | Freq: Two times a day (BID) | ORAL | 0 refills | Status: AC
Start: 2022-10-29 — End: 2022-11-05

## 2022-10-29 MED ORDER — FLUCONAZOLE 150 MG PO TABS
150.0000 mg | ORAL_TABLET | Freq: Once | ORAL | 0 refills | Status: AC
Start: 2022-10-29 — End: 2022-10-29

## 2022-10-29 NOTE — Progress Notes (Signed)

## 2022-11-18 ENCOUNTER — Other Ambulatory Visit: Payer: Self-pay | Admitting: Neurology

## 2022-11-18 NOTE — Telephone Encounter (Signed)
Last seen on 07/11/22 No follow up scheduled   Did you patient to continue Rx?  Rx pending for approval if so.

## 2022-12-11 ENCOUNTER — Other Ambulatory Visit: Payer: Self-pay | Admitting: Neurology

## 2023-01-11 ENCOUNTER — Encounter: Payer: Self-pay | Admitting: Neurology

## 2023-01-31 ENCOUNTER — Other Ambulatory Visit: Payer: Self-pay | Admitting: Nurse Practitioner

## 2023-04-01 ENCOUNTER — Other Ambulatory Visit: Payer: Self-pay | Admitting: Neurology

## 2023-04-13 ENCOUNTER — Encounter: Payer: Self-pay | Admitting: Neurology

## 2023-04-17 ENCOUNTER — Telehealth: Payer: Self-pay | Admitting: Neurology

## 2023-04-17 MED ORDER — AIMOVIG 140 MG/ML ~~LOC~~ SOAJ: 140.0000 mg | SUBCUTANEOUS | Status: DC

## 2023-04-17 NOTE — Telephone Encounter (Signed)
 Pt picked up sampes today of emovig. Once she is able to order her meds through her insurance (on the 27th) should she wait a certain time frame to take it or is it ok to take as soon as she gets it.

## 2023-04-17 NOTE — Addendum Note (Signed)
 Addended by: Lenn Cal on: 04/17/2023 01:10 PM   Modules accepted: Orders

## 2023-04-17 NOTE — Telephone Encounter (Signed)
 Lets give her a sample if we have some.

## 2023-05-17 ENCOUNTER — Encounter: Payer: Self-pay | Admitting: Neurology

## 2023-05-29 ENCOUNTER — Ambulatory Visit: Admitting: Neurology

## 2023-05-29 ENCOUNTER — Encounter: Payer: Self-pay | Admitting: Neurology

## 2023-07-10 ENCOUNTER — Other Ambulatory Visit: Payer: Self-pay | Admitting: Neurology

## 2023-07-17 ENCOUNTER — Ambulatory Visit: Payer: Medicaid Other | Admitting: Neurology

## 2023-08-07 ENCOUNTER — Other Ambulatory Visit: Payer: Self-pay | Admitting: Neurology
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
## 2023-08-23 ENCOUNTER — Ambulatory Visit (INDEPENDENT_AMBULATORY_CARE_PROVIDER_SITE_OTHER): Admitting: Neurology

## 2023-08-23 ENCOUNTER — Encounter: Payer: Self-pay | Admitting: Neurology

## 2023-08-23 VITALS — BP 120/33 | HR 87 | Ht 67.0 in | Wt 165.0 lb

## 2023-08-23 DIAGNOSIS — G43709 Chronic migraine without aura, not intractable, without status migrainosus: Secondary | ICD-10-CM | POA: Diagnosis not present

## 2023-08-23 DIAGNOSIS — G40909 Epilepsy, unspecified, not intractable, without status epilepticus: Secondary | ICD-10-CM | POA: Diagnosis not present

## 2023-08-23 DIAGNOSIS — G43909 Migraine, unspecified, not intractable, without status migrainosus: Secondary | ICD-10-CM | POA: Insufficient documentation

## 2023-08-23 MED ORDER — LEVETIRACETAM ER 500 MG PO TB24: 1000.0000 mg | ORAL_TABLET | Freq: Every day | ORAL | 3 refills | Status: AC

## 2023-08-23 MED ORDER — AIMOVIG 140 MG/ML ~~LOC~~ SOAJ: 140.0000 mg | SUBCUTANEOUS | 11 refills | Status: DC

## 2023-08-23 MED ORDER — SUMATRIPTAN SUCCINATE 50 MG PO TABS: 50.0000 mg | ORAL_TABLET | Freq: Once | ORAL | 11 refills | Status: AC

## 2023-08-23 MED ORDER — FOLIC ACID 1 MG PO TABS: 1.0000 mg | ORAL_TABLET | Freq: Every day | ORAL | 3 refills | Status: AC

## 2023-08-23 NOTE — Patient Instructions (Signed)
 Restart Keppra  immediately, at risk for seizure with medication noncompliance.  I will call you in 1 month to come for labs.  Call for any seizure activity, if spells of vision lag continue despite medication compliance please let me know and we will do further workup. [place] law is no driving until seizure free 6 months.

## 2023-09-14 ENCOUNTER — Encounter: Payer: Self-pay | Admitting: Neurology

## 2023-09-26 ENCOUNTER — Telehealth: Payer: Self-pay | Admitting: Neurology

## 2023-09-26 DIAGNOSIS — G40909 Epilepsy, unspecified, not intractable, without status epilepticus: Secondary | ICD-10-CM

## 2023-09-26 NOTE — Telephone Encounter (Signed)
 Please call and ask her to come for labs. Thanks  Orders Placed This Encounter  Procedures   CBC with Differential/Platelet   CMP   Levetiracetam  level

## 2023-10-17 ENCOUNTER — Encounter: Payer: Self-pay | Admitting: *Deleted

## 2024-01-17 ENCOUNTER — Telehealth: Payer: Self-pay

## 2024-01-17 ENCOUNTER — Other Ambulatory Visit (HOSPITAL_COMMUNITY): Payer: Self-pay

## 2024-01-17 NOTE — Telephone Encounter (Signed)
 SABRA

## 2024-01-19 ENCOUNTER — Other Ambulatory Visit (HOSPITAL_COMMUNITY): Payer: Self-pay

## 2024-01-22 ENCOUNTER — Other Ambulatory Visit (HOSPITAL_COMMUNITY): Payer: Self-pay

## 2024-01-23 ENCOUNTER — Other Ambulatory Visit (HOSPITAL_COMMUNITY): Payer: Self-pay

## 2024-01-23 NOTE — Telephone Encounter (Signed)
 Pharmacy Patient Advocate Encounter   Received notification from CoverMyMeds that prior authorization for Aimovig  is required/requested.   Insurance verification completed.   The patient is insured through [facility].   Per test claim: PA required; PA submitted to above mentioned insurance via Fax Key/confirmation #/EOC [id number] Status is pending

## 2024-01-24 NOTE — Telephone Encounter (Signed)
 Pharmacy Patient Advocate Encounter  Received notification from [facility] that Prior Authorization for Aimovig  has been DENIED.  Full denial letter will be uploaded to the media tab. See denial reason below.   PA #/Case ID/Reference #: Not listed on denial letter

## 2024-01-30 MED ORDER — EMGALITY 120 MG/ML ~~LOC~~ SOAJ: 120.0000 mg | SUBCUTANEOUS | 11 refills | Status: AC

## 2024-01-30 NOTE — Addendum Note (Signed)
 Addended by: GAYLAND LAURAINE PARAS on: 01/30/2024 10:59 AM   Modules accepted: Orders

## 2024-02-02 ENCOUNTER — Telehealth: Payer: Self-pay

## 2024-02-02 ENCOUNTER — Other Ambulatory Visit (HOSPITAL_COMMUNITY): Payer: Self-pay

## 2024-02-02 NOTE — Telephone Encounter (Signed)
 Pharmacy Patient Advocate Encounter  Received notification from [facility] that Prior Authorization for Emgality  has been APPROVED from 02/02/2024 to 05/02/2024. Unable to obtain price due to refill too soon rejection, last fill date 02/02/2024 next available fill date1/31/2026   PA #/Case ID/Reference #: [id number]

## 2024-02-02 NOTE — Telephone Encounter (Signed)
 Pharmacy Patient Advocate Encounter   Received notification from Physician's Office that prior authorization for Emgality  is required/requested.   Insurance verification completed.   The patient is insured through [facility].   Per test claim: PA required; PA submitted to above mentioned insurance via Fax Key/confirmation #/EOC N/A Status is pending

## 2024-02-04 ENCOUNTER — Encounter: Payer: Self-pay | Admitting: Neurology

## 2024-02-05 ENCOUNTER — Other Ambulatory Visit (HOSPITAL_COMMUNITY): Payer: Self-pay

## 2024-02-05 MED ORDER — EMGALITY 120 MG/ML ~~LOC~~ SOAJ: SUBCUTANEOUS | 0 refills | Status: AC

## 2024-02-05 MED ORDER — EMGALITY 120 MG/ML ~~LOC~~ SOAJ: 120.0000 mg | SUBCUTANEOUS | 0 refills | Status: DC

## 2024-02-06 ENCOUNTER — Other Ambulatory Visit (HOSPITAL_COMMUNITY): Payer: Self-pay

## 2024-02-06 MED ORDER — EMGALITY 120 MG/ML ~~LOC~~ SOAJ: 120.0000 mg | SUBCUTANEOUS | Status: AC

## 2024-02-06 NOTE — Telephone Encounter (Signed)
 Pharmacy Patient Advocate Encounter   Received notification from Physician's Office that prior authorization for Emgality  loading dose is required/requested.   Insurance verification completed.   The patient is insured through [facility].   Per test claim: PA required; PA submitted to above mentioned insurance via Fax Key/confirmation #/EOC [id number] Status is pending  I called [facility] to see if we could get this switched over so PT could get one more pen, However bc PT has already picked up the pen, we cannot just do an override to get the second pen,. A new PA has to be submitted for the loading dose. PA has been submitted-awaiting determination. I did not submit originally for loading dose due to no RX was written for loading dose and PT was already on a monthly pen and usually will only do the maintenance when switching over from one injectable to the other in my past experience.

## 2024-02-13 ENCOUNTER — Other Ambulatory Visit (HOSPITAL_COMMUNITY): Payer: Self-pay

## 2024-02-13 NOTE — Telephone Encounter (Signed)
 Pharmacy Patient Advocate Encounter  Received notification from [facility] that Prior Authorization for Emgality  120MG /ML Loading Dose has been APPROVED from 02-06-2024 to 05-02-2024. Ran test claim, Copay is $35.00 for 2mL (2 pens). This test claim was processed through [facility]- copay amounts may vary at other pharmacies due to pharmacy/plan contracts, or as the patient moves through the different stages of their insurance plan.

## 2024-02-15 ENCOUNTER — Encounter: Payer: Self-pay | Admitting: Radiology

## 2024-02-15 ENCOUNTER — Other Ambulatory Visit (HOSPITAL_COMMUNITY)
Admission: RE | Admit: 2024-02-15 | Discharge: 2024-02-15 | Disposition: A | Source: Ambulatory Visit | Attending: Urology | Admitting: Urology

## 2024-02-15 ENCOUNTER — Ambulatory Visit (INDEPENDENT_AMBULATORY_CARE_PROVIDER_SITE_OTHER): Payer: Self-pay | Admitting: Radiology

## 2024-02-15 VITALS — BP 116/76 | Ht 66.5 in | Wt 157.0 lb

## 2024-02-15 DIAGNOSIS — Z113 Encounter for screening for infections with a predominantly sexual mode of transmission: Secondary | ICD-10-CM | POA: Diagnosis present

## 2024-02-15 DIAGNOSIS — Z1331 Encounter for screening for depression: Secondary | ICD-10-CM | POA: Diagnosis not present

## 2024-02-15 DIAGNOSIS — Z975 Presence of (intrauterine) contraceptive device: Secondary | ICD-10-CM

## 2024-02-15 DIAGNOSIS — Z01419 Encounter for gynecological examination (general) (routine) without abnormal findings: Secondary | ICD-10-CM | POA: Insufficient documentation

## 2024-02-15 NOTE — Patient Instructions (Signed)

## 2024-02-15 NOTE — Progress Notes (Signed)
 "  Caitlin Rhodes [birth year] [id number]   History:  34 y.o. G5P4 presents for annual exam. Would like Caitlin Rhodes  IUD strings trimmed and STI screen with pap. Has some increased d/c before menses.  Gynecologic History Patient's last menstrual period was 01/23/2024 (approximate). Period Cycle (Days): 28 Period Duration (Days): 6 Period Pattern: Regular Menstrual Flow: Moderate Menstrual Control: Tampon Dysmenorrhea: (!) Moderate (moderate to severe) Dysmenorrhea Symptoms: Cramping Contraception/Family planning: IUD Sexually active: yes Last Pap: 2023. Results were: ASCUS   Obstetric History OB History  Gravida Para Term Preterm AB Living  5 4 4  0 1 4  SAB IAB Ectopic Multiple Live Births  1 0 0 0 4    # Outcome Date GA Lbr Len/2nd Weight Sex Type Anes PTL Lv  5 Term 07/04/18 [redacted]w[redacted]d 01:15 / 00:34 5 lb 9.1 oz (2.526 kg) F Vag-Spont EPI  LIV  4 SAB           3 Term      Vag-Spont     2 Term      Vag-Spont     1 Term      Vag-Spont          02/15/2024   10:51 AM 04/20/2022    3:09 PM  Depression screen PHQ 2/9  Decreased Interest 1 0  Down, Depressed, Hopeless 1 0  PHQ - 2 Score 2 0  Altered sleeping 3   Change in appetite 3   Feeling bad or failure about yourself  3   Trouble concentrating 3   Moving slowly or fidgety/restless 2   Suicidal thoughts 0   PHQ-9 Score 16   Difficult doing work/chores Very difficult      The following portions of the patient's history were reviewed and updated as appropriate: allergies, current medications, past family history, past medical history, past social history, past surgical history, and problem list.  Review of Systems  All other systems reviewed and are negative.   Past medical history, past surgical history, family history and social history were all reviewed and documented in the EPIC chart.  Exam:  Vitals:   02/15/24 1049  BP: 116/76  Weight: 157 lb (71.2 kg)  Height: 5' 6.5 (1.689 m)   Body mass index is  24.96 kg/m.  Physical Exam Vitals and nursing note reviewed. Exam conducted with a chaperone present.  Constitutional:      Appearance: Normal appearance. She is normal weight.  HENT:     Head: Normocephalic and atraumatic.  Neck:     Thyroid: No thyroid mass, thyromegaly or thyroid tenderness.  Cardiovascular:     Rate and Rhythm: Regular rhythm.     Heart sounds: Normal heart sounds.  Pulmonary:     Effort: Pulmonary effort is normal.     Breath sounds: Normal breath sounds.  Chest:  Breasts:    Breasts are symmetrical.     Right: Normal. No inverted nipple, mass, nipple discharge, skin change or tenderness.     Left: Normal. No inverted nipple, mass, nipple discharge, skin change or tenderness.     Comments: Bilateral implants Abdominal:     General: Abdomen is flat. Bowel sounds are normal.     Palpations: Abdomen is soft.  Genitourinary:    General: Normal vulva.     Vagina: Normal. No vaginal discharge, bleeding or lesions.     Cervix: Normal. No discharge or lesion.     Uterus: Normal. Not enlarged and not tender.      Adnexa: Right adnexa  normal and left adnexa normal.       Right: No mass, tenderness or fullness.         Left: No mass, tenderness or fullness.       Comments: IUD strings trimmed to 2cm Lymphadenopathy:     Upper Body:     Right upper body: No axillary adenopathy.     Left upper body: No axillary adenopathy.  Skin:    General: Skin is warm and dry.  Neurological:     Mental Status: She is alert and oriented to person, place, and time.  Psychiatric:        Mood and Affect: Mood normal.        Thought Content: Thought content normal.        Judgment: Judgment normal.      Darice Hoit, CMA present for exam  Assessment/Plan:   1. Well woman exam with routine gynecological exam (Primary) - Cytology - PAP( [place])  2. Screening for STDs (sexually transmitted diseases) - Cytology - PAP( [place])  3. IUD (intrauterine device) in  place  4. Depression screen Positive, follow up with PCP     Return in about 1 year (around 02/14/2025) for Annual.  GINETTE COZIER B WHNP-BC 11:07 AM 02/15/2024 "

## 2024-02-19 ENCOUNTER — Ambulatory Visit: Payer: Self-pay | Admitting: Radiology

## 2024-02-19 LAB — CYTOLOGY - PAP
Chlamydia: NEGATIVE
Comment: NEGATIVE
Comment: NEGATIVE
Comment: NEGATIVE
Comment: NORMAL
High risk HPV: NEGATIVE
Neisseria Gonorrhea: NEGATIVE
Trichomonas: NEGATIVE

## 2024-02-19 NOTE — Telephone Encounter (Signed)
 Routing to Jami to review 02/15/24 PAP results.

## 2024-03-13 ENCOUNTER — Telehealth: Admitting: Neurology

## 2025-02-18 ENCOUNTER — Ambulatory Visit: Admitting: Radiology
